# Patient Record
Sex: Female | Born: 1980 | Race: White | Hispanic: Yes | Marital: Married | Smoking: Never smoker
Health system: Southern US, Community
[De-identification: ages and names within clinical notes are randomized; demographics above are authoritative.]

## PROBLEM LIST (undated history)

## (undated) ENCOUNTER — Emergency Department (HOSPITAL_COMMUNITY): Admission: EM | Payer: No Typology Code available for payment source | Source: Home / Self Care

## (undated) DIAGNOSIS — E785 Hyperlipidemia, unspecified: Secondary | ICD-10-CM

## (undated) DIAGNOSIS — E119 Type 2 diabetes mellitus without complications: Secondary | ICD-10-CM

## (undated) DIAGNOSIS — Z9889 Other specified postprocedural states: Secondary | ICD-10-CM

## (undated) DIAGNOSIS — O24419 Gestational diabetes mellitus in pregnancy, unspecified control: Secondary | ICD-10-CM

## (undated) HISTORY — DX: Gestational diabetes mellitus in pregnancy, unspecified control: O24.419

## (undated) HISTORY — PX: BREAST EXCISIONAL BIOPSY: SUR124

## (undated) HISTORY — PX: BREAST CYST ASPIRATION: SHX578

## (undated) HISTORY — DX: Hyperlipidemia, unspecified: E78.5

---

## 2003-03-03 ENCOUNTER — Encounter: Payer: Self-pay | Admitting: *Deleted

## 2003-03-03 ENCOUNTER — Inpatient Hospital Stay (HOSPITAL_COMMUNITY): Admission: AD | Admit: 2003-03-03 | Discharge: 2003-03-03 | Payer: Self-pay | Admitting: *Deleted

## 2003-03-04 ENCOUNTER — Inpatient Hospital Stay (HOSPITAL_COMMUNITY): Admission: AD | Admit: 2003-03-04 | Discharge: 2003-03-05 | Payer: Self-pay | Admitting: Obstetrics and Gynecology

## 2003-03-05 ENCOUNTER — Encounter (INDEPENDENT_AMBULATORY_CARE_PROVIDER_SITE_OTHER): Payer: Self-pay | Admitting: *Deleted

## 2003-03-16 ENCOUNTER — Encounter: Admission: RE | Admit: 2003-03-16 | Discharge: 2003-03-16 | Payer: Self-pay | Admitting: Obstetrics & Gynecology

## 2003-11-13 ENCOUNTER — Other Ambulatory Visit: Admission: RE | Admit: 2003-11-13 | Discharge: 2003-11-13 | Payer: Self-pay | Admitting: Obstetrics and Gynecology

## 2004-01-14 ENCOUNTER — Inpatient Hospital Stay (HOSPITAL_COMMUNITY): Admission: AD | Admit: 2004-01-14 | Discharge: 2004-01-14 | Payer: Self-pay | Admitting: Obstetrics & Gynecology

## 2004-05-09 ENCOUNTER — Ambulatory Visit: Payer: Self-pay | Admitting: Obstetrics & Gynecology

## 2004-05-12 ENCOUNTER — Ambulatory Visit: Payer: Self-pay | Admitting: *Deleted

## 2004-05-15 ENCOUNTER — Ambulatory Visit: Payer: Self-pay | Admitting: Obstetrics & Gynecology

## 2004-05-15 ENCOUNTER — Inpatient Hospital Stay (HOSPITAL_COMMUNITY): Admission: AD | Admit: 2004-05-15 | Discharge: 2004-05-18 | Payer: Self-pay | Admitting: Family Medicine

## 2005-02-23 ENCOUNTER — Ambulatory Visit: Payer: Self-pay | Admitting: Family Medicine

## 2005-02-28 ENCOUNTER — Encounter (INDEPENDENT_AMBULATORY_CARE_PROVIDER_SITE_OTHER): Payer: Self-pay | Admitting: *Deleted

## 2005-02-28 LAB — CONVERTED CEMR LAB

## 2005-03-02 ENCOUNTER — Ambulatory Visit: Payer: Self-pay | Admitting: Family Medicine

## 2005-03-07 ENCOUNTER — Ambulatory Visit: Payer: Self-pay | Admitting: Family Medicine

## 2005-03-22 ENCOUNTER — Ambulatory Visit (HOSPITAL_COMMUNITY): Admission: RE | Admit: 2005-03-22 | Discharge: 2005-03-22 | Payer: Self-pay | Admitting: Family Medicine

## 2005-03-30 ENCOUNTER — Ambulatory Visit: Payer: Self-pay | Admitting: Family Medicine

## 2005-05-03 ENCOUNTER — Ambulatory Visit: Payer: Self-pay | Admitting: Family Medicine

## 2005-05-30 ENCOUNTER — Ambulatory Visit: Payer: Self-pay | Admitting: Family Medicine

## 2005-06-15 ENCOUNTER — Ambulatory Visit: Payer: Self-pay | Admitting: Family Medicine

## 2005-07-05 ENCOUNTER — Ambulatory Visit: Payer: Self-pay | Admitting: Family Medicine

## 2005-07-20 ENCOUNTER — Ambulatory Visit: Payer: Self-pay | Admitting: Family Medicine

## 2005-08-02 ENCOUNTER — Ambulatory Visit: Payer: Self-pay | Admitting: Family Medicine

## 2005-08-20 ENCOUNTER — Inpatient Hospital Stay (HOSPITAL_COMMUNITY): Admission: AD | Admit: 2005-08-20 | Discharge: 2005-08-22 | Payer: Self-pay | Admitting: Obstetrics & Gynecology

## 2005-08-20 ENCOUNTER — Ambulatory Visit: Payer: Self-pay | Admitting: Obstetrics & Gynecology

## 2005-10-13 ENCOUNTER — Ambulatory Visit: Payer: Self-pay | Admitting: Sports Medicine

## 2005-11-03 ENCOUNTER — Ambulatory Visit: Payer: Self-pay | Admitting: Family Medicine

## 2006-03-11 IMAGING — US US OB COMP +14 WK
1 series · 13 of 28 positions shown · non-contrast
Comparison: none

CLINICAL DATA: G3 P1 SAB1.  LMP 11/05/04.  Assess fetal anatomy and gestational age.

[Series 1: us ob comp +14 wk · 13 of 83 slices shown]
[im 4/83]
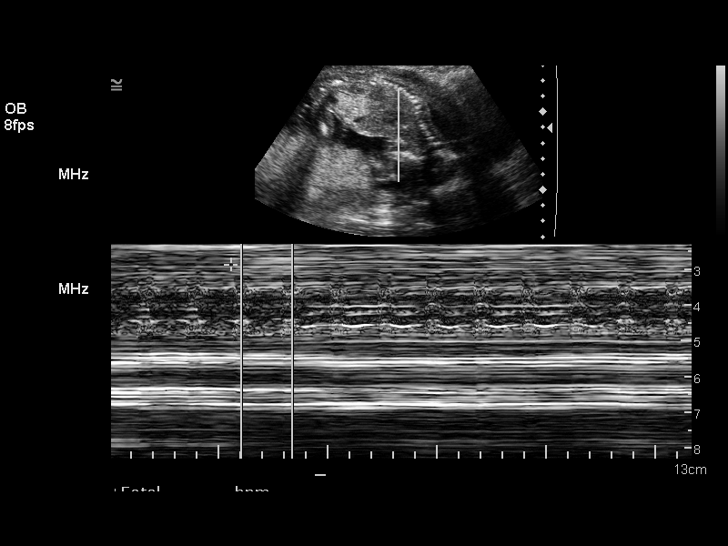
[im 10/83]
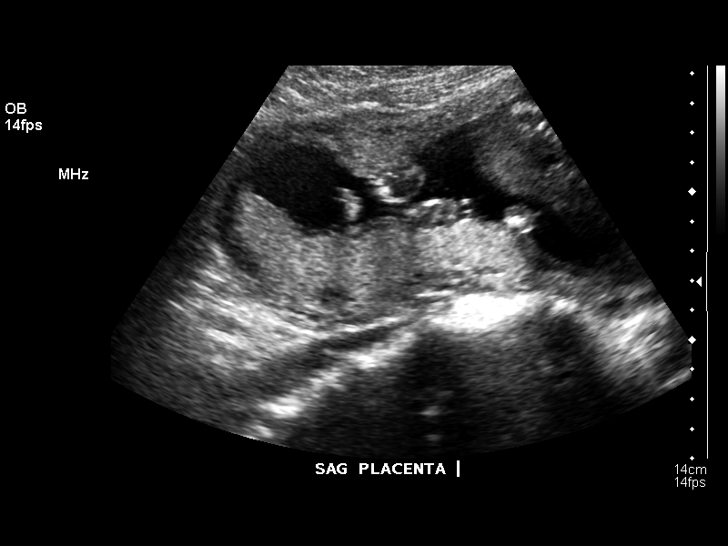
[im 16/83]
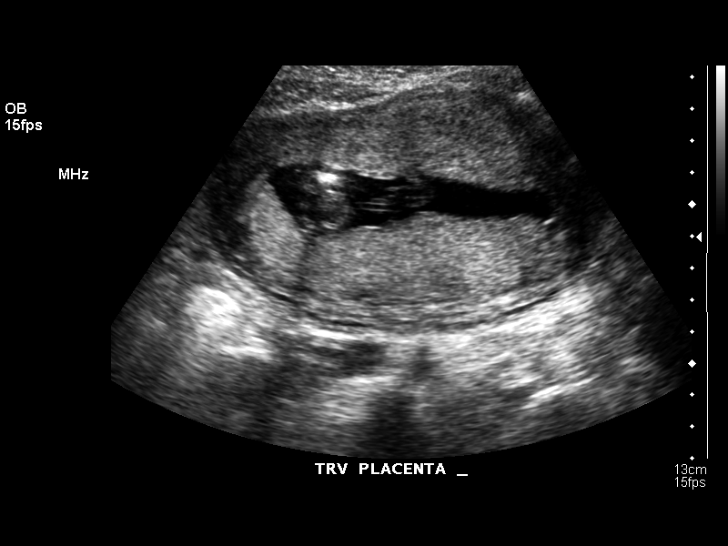
[im 22/83]
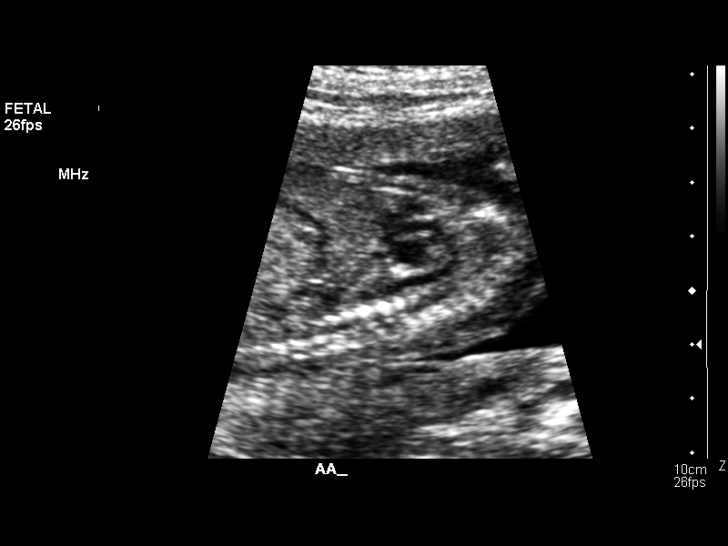
[im 28/83]
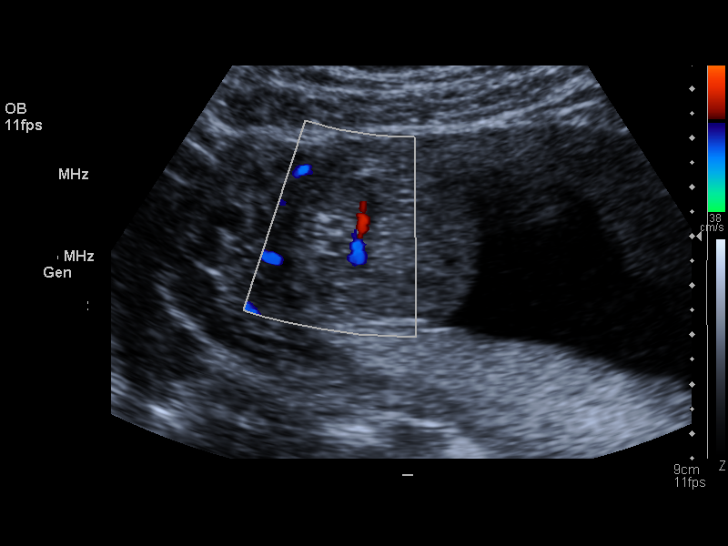
[im 34/83]
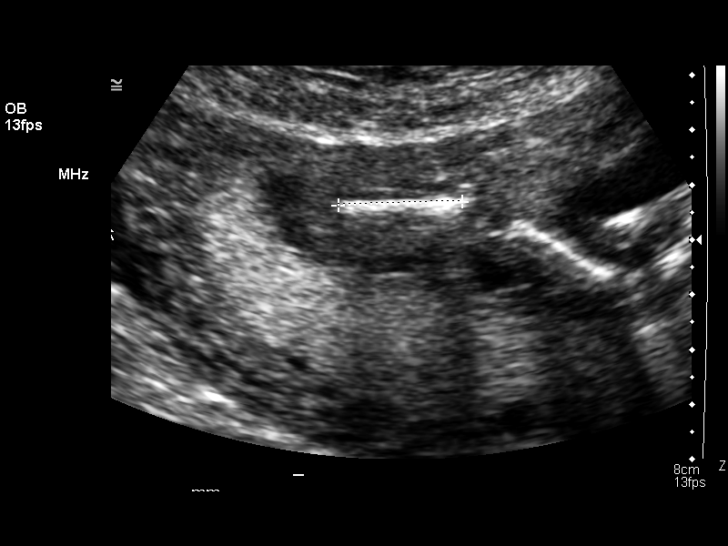
[im 43/83]
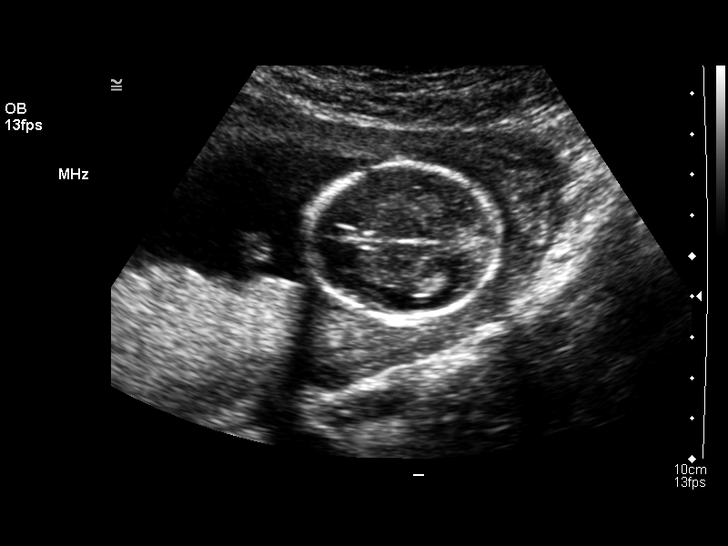
[im 49/83]
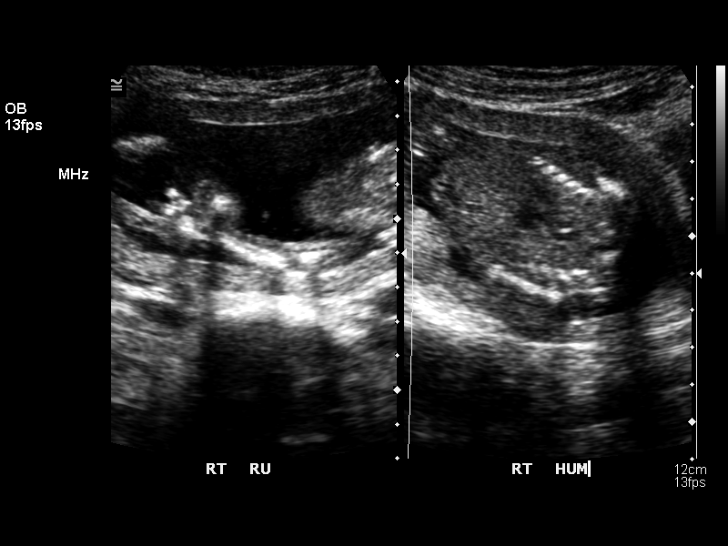
[im 55/83]
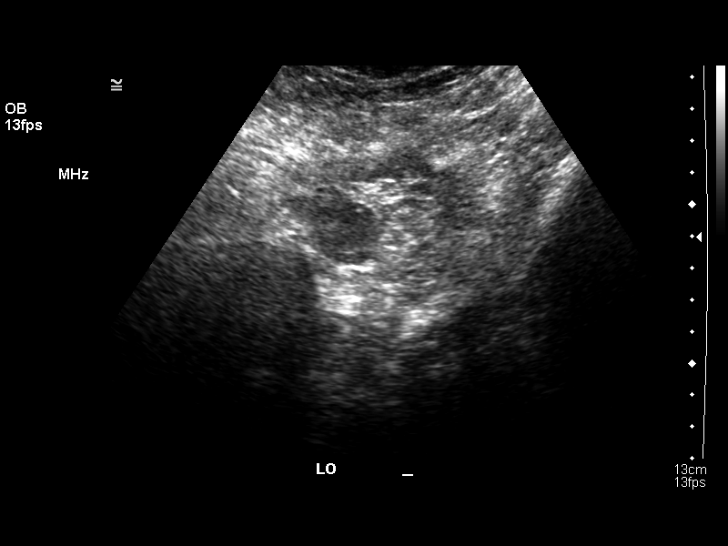
[im 61/83]
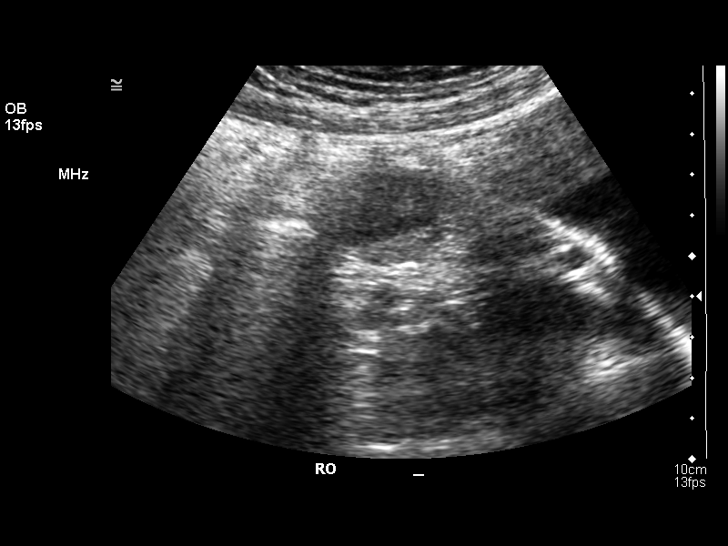
[im 67/83]
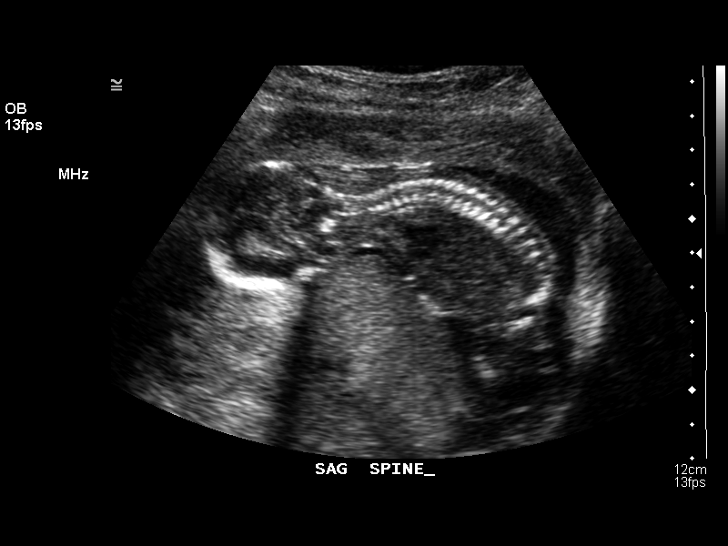
[im 73/83]
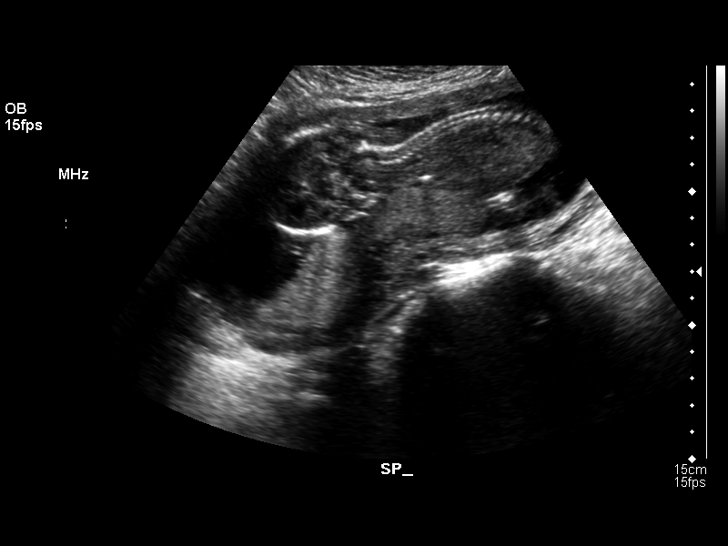
[im 79/83]
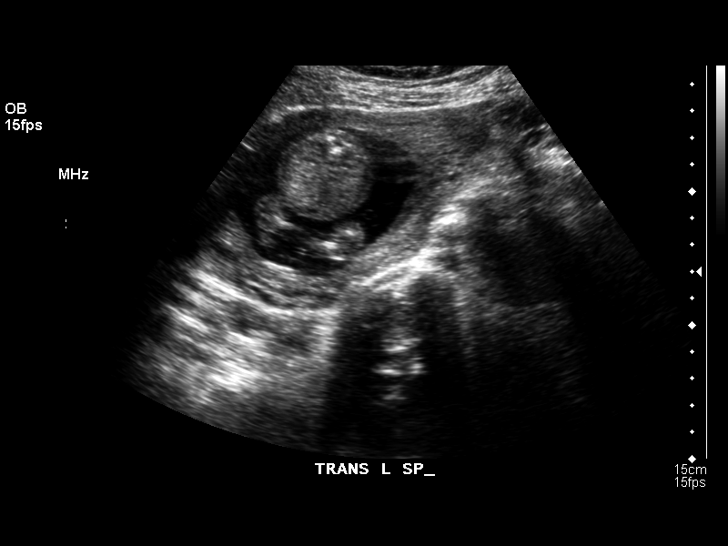

[13 of 28 positions shown; findings below may reference images not displayed]

OBSTETRICAL ULTRASOUND:
 Number of Fetuses:  1
 Heart Rate:  128
 Movement:  Yes
 Breathing:  No  
 Presentation:  Cephalic
 Placental Location:  Posterior
 Grade:  I
 Previa:  No
 Amniotic Fluid (Subjective):  Normal
 Amniotic Fluid (Objective):  4.9 cm Vertical pocket 

 FETAL BIOMETRY
 BPD:   3.8 cm   17 w 4 d
 HC:   14.4 cm   17 w 4 d
 AC:   12.0 cm   17 w 5 d
 FL:    2.2 cm  16 w 4 d

 MEAN GA:  17 w 3 d
 GA BY LMP:  19 w 4 d

 FETAL ANATOMY
 Lateral Ventricles:  Visualized 
 Thalami/CSP:      Visualized 
 Posterior Fossa:  Visualized 
 Nuchal Region:  Visualized 
 Spine:      Visualized 
 4 Chamber Heart on Left:      Not visualized 
 Stomach on Left:      Visualized 
 3 Vessel Cord:  Visualized 
 Cord Insertion site:  Visualized 
 Kidneys:  Visualized 
 Bladder:  Visualized 
 Extremities:      Visualized 

 ADDITIONAL ANATOMY VISUALIZED:  Upper lip, orbits, profile, diaphragm, 5th digit, ductal arch, aortic arch and male genitalia.  

 MATERNAL UTERINE AND ADNEXAL FINDINGS
 Cervix:  4.3 cm Transabdominally.  Normal ovaries.
IMPRESSION: 1.  Single living intrauterine fetus in cephalic presentation.  Patient would be 19 weeks 4 days by LMP dating, but measures 17 weeks 3 days today suggesting that dates may be inaccurate.
 2.  Cardiac anatomy and heel not seen.  Otherwise, no anatomic abnormality noted.

## 2006-09-27 DIAGNOSIS — N921 Excessive and frequent menstruation with irregular cycle: Secondary | ICD-10-CM

## 2006-09-28 ENCOUNTER — Encounter (INDEPENDENT_AMBULATORY_CARE_PROVIDER_SITE_OTHER): Payer: Self-pay | Admitting: *Deleted

## 2006-12-12 ENCOUNTER — Encounter (INDEPENDENT_AMBULATORY_CARE_PROVIDER_SITE_OTHER): Payer: Self-pay | Admitting: *Deleted

## 2007-01-08 ENCOUNTER — Emergency Department (HOSPITAL_COMMUNITY): Admission: EM | Admit: 2007-01-08 | Discharge: 2007-01-08 | Payer: Self-pay | Admitting: Emergency Medicine

## 2007-01-09 ENCOUNTER — Encounter: Payer: Self-pay | Admitting: Family Medicine

## 2007-01-09 ENCOUNTER — Ambulatory Visit: Payer: Self-pay | Admitting: Family Medicine

## 2007-05-27 ENCOUNTER — Telehealth (INDEPENDENT_AMBULATORY_CARE_PROVIDER_SITE_OTHER): Payer: Self-pay | Admitting: *Deleted

## 2007-05-28 ENCOUNTER — Ambulatory Visit: Payer: Self-pay | Admitting: Family Medicine

## 2007-05-28 ENCOUNTER — Encounter: Payer: Self-pay | Admitting: Family Medicine

## 2007-05-28 LAB — CONVERTED CEMR LAB
Bilirubin Urine: NEGATIVE
Ketones, urine, test strip: NEGATIVE
MCV: 90.9 fL (ref 78.0–100.0)
Nitrite: NEGATIVE
Platelets: 243 10*3/uL (ref 150–400)
RDW: 13.7 % (ref 11.5–14.0)
Specific Gravity, Urine: 1.015
WBC: 5 10*3/uL (ref 4.0–10.5)

## 2007-05-31 ENCOUNTER — Ambulatory Visit: Payer: Self-pay | Admitting: Family Medicine

## 2007-07-03 ENCOUNTER — Telehealth (INDEPENDENT_AMBULATORY_CARE_PROVIDER_SITE_OTHER): Payer: Self-pay | Admitting: *Deleted

## 2007-07-03 ENCOUNTER — Ambulatory Visit: Payer: Self-pay | Admitting: Family Medicine

## 2007-08-06 ENCOUNTER — Encounter: Payer: Self-pay | Admitting: *Deleted

## 2007-08-07 ENCOUNTER — Ambulatory Visit: Payer: Self-pay | Admitting: Family Medicine

## 2007-08-08 ENCOUNTER — Encounter: Admission: RE | Admit: 2007-08-08 | Discharge: 2007-08-08 | Payer: Self-pay | Admitting: Family Medicine

## 2007-08-08 ENCOUNTER — Emergency Department (HOSPITAL_COMMUNITY): Admission: EM | Admit: 2007-08-08 | Discharge: 2007-08-09 | Payer: Self-pay | Admitting: Emergency Medicine

## 2007-08-08 ENCOUNTER — Encounter (INDEPENDENT_AMBULATORY_CARE_PROVIDER_SITE_OTHER): Payer: Self-pay | Admitting: Family Medicine

## 2007-08-09 ENCOUNTER — Encounter: Payer: Self-pay | Admitting: *Deleted

## 2007-08-28 ENCOUNTER — Encounter: Admission: RE | Admit: 2007-08-28 | Discharge: 2007-08-28 | Payer: Self-pay | Admitting: Family Medicine

## 2007-09-10 ENCOUNTER — Telehealth: Payer: Self-pay | Admitting: *Deleted

## 2007-09-11 ENCOUNTER — Ambulatory Visit: Payer: Self-pay | Admitting: Family Medicine

## 2007-09-11 ENCOUNTER — Encounter: Admission: RE | Admit: 2007-09-11 | Discharge: 2007-09-11 | Payer: Self-pay | Admitting: Family Medicine

## 2007-09-12 ENCOUNTER — Encounter (INDEPENDENT_AMBULATORY_CARE_PROVIDER_SITE_OTHER): Payer: Self-pay | Admitting: Diagnostic Radiology

## 2007-09-12 ENCOUNTER — Encounter: Admission: RE | Admit: 2007-09-12 | Discharge: 2007-09-12 | Payer: Self-pay | Admitting: Internal Medicine

## 2007-09-24 ENCOUNTER — Ambulatory Visit: Payer: Self-pay | Admitting: Family Medicine

## 2007-09-24 ENCOUNTER — Encounter (INDEPENDENT_AMBULATORY_CARE_PROVIDER_SITE_OTHER): Payer: Self-pay | Admitting: Family Medicine

## 2007-09-24 LAB — CONVERTED CEMR LAB: Chlamydia, DNA Probe: NEGATIVE

## 2007-10-14 ENCOUNTER — Encounter (INDEPENDENT_AMBULATORY_CARE_PROVIDER_SITE_OTHER): Payer: Self-pay | Admitting: Family Medicine

## 2007-10-14 ENCOUNTER — Encounter: Admission: RE | Admit: 2007-10-14 | Discharge: 2007-10-14 | Payer: Self-pay | Admitting: Internal Medicine

## 2007-10-14 ENCOUNTER — Ambulatory Visit: Payer: Self-pay | Admitting: Family Medicine

## 2007-10-14 ENCOUNTER — Ambulatory Visit (HOSPITAL_COMMUNITY): Admission: RE | Admit: 2007-10-14 | Discharge: 2007-10-14 | Payer: Self-pay | Admitting: *Deleted

## 2007-10-14 LAB — CONVERTED CEMR LAB
ALT: 21 units/L (ref 0–35)
AST: 17 units/L (ref 0–37)
Albumin: 4.8 g/dL (ref 3.5–5.2)
Alkaline Phosphatase: 97 units/L (ref 39–117)
Basophils Absolute: 0 10*3/uL (ref 0.0–0.1)
Basophils Relative: 0 % (ref 0–1)
Calcium: 9.6 mg/dL (ref 8.4–10.5)
Chloride: 104 meq/L (ref 96–112)
Eosinophils Absolute: 0.2 10*3/uL (ref 0.0–0.7)
MCHC: 33.3 g/dL (ref 30.0–36.0)
Monocytes Relative: 5 % (ref 3–12)
Neutro Abs: 4.3 10*3/uL (ref 1.7–7.7)
Neutrophils Relative %: 60 % (ref 43–77)
Platelets: 333 10*3/uL (ref 150–400)
Potassium: 4.2 meq/L (ref 3.5–5.3)
RBC: 4.69 M/uL (ref 3.87–5.11)
RDW: 13.5 % (ref 11.5–15.5)
Sodium: 139 meq/L (ref 135–145)

## 2007-10-15 ENCOUNTER — Encounter: Payer: Self-pay | Admitting: Family Medicine

## 2007-10-16 ENCOUNTER — Ambulatory Visit: Payer: Self-pay | Admitting: Family Medicine

## 2007-10-18 ENCOUNTER — Ambulatory Visit: Payer: Self-pay | Admitting: Family Medicine

## 2007-10-21 ENCOUNTER — Encounter (INDEPENDENT_AMBULATORY_CARE_PROVIDER_SITE_OTHER): Payer: Self-pay | Admitting: Family Medicine

## 2007-10-21 ENCOUNTER — Telehealth (INDEPENDENT_AMBULATORY_CARE_PROVIDER_SITE_OTHER): Payer: Self-pay | Admitting: *Deleted

## 2007-10-21 ENCOUNTER — Emergency Department (HOSPITAL_COMMUNITY): Admission: EM | Admit: 2007-10-21 | Discharge: 2007-10-22 | Payer: Self-pay | Admitting: Emergency Medicine

## 2007-10-24 ENCOUNTER — Encounter (INDEPENDENT_AMBULATORY_CARE_PROVIDER_SITE_OTHER): Payer: Self-pay | Admitting: Family Medicine

## 2007-11-01 ENCOUNTER — Ambulatory Visit (HOSPITAL_COMMUNITY): Admission: RE | Admit: 2007-11-01 | Discharge: 2007-11-01 | Payer: Self-pay | Admitting: *Deleted

## 2007-11-01 ENCOUNTER — Encounter (INDEPENDENT_AMBULATORY_CARE_PROVIDER_SITE_OTHER): Payer: Self-pay | Admitting: *Deleted

## 2007-11-07 ENCOUNTER — Encounter (INDEPENDENT_AMBULATORY_CARE_PROVIDER_SITE_OTHER): Payer: Self-pay | Admitting: Family Medicine

## 2008-01-16 ENCOUNTER — Ambulatory Visit: Payer: Self-pay | Admitting: Family Medicine

## 2008-02-05 ENCOUNTER — Ambulatory Visit: Payer: Self-pay | Admitting: Family Medicine

## 2008-02-05 ENCOUNTER — Encounter (INDEPENDENT_AMBULATORY_CARE_PROVIDER_SITE_OTHER): Payer: Self-pay | Admitting: Family Medicine

## 2008-02-05 DIAGNOSIS — L819 Disorder of pigmentation, unspecified: Secondary | ICD-10-CM

## 2008-02-12 ENCOUNTER — Encounter (INDEPENDENT_AMBULATORY_CARE_PROVIDER_SITE_OTHER): Payer: Self-pay | Admitting: Family Medicine

## 2008-02-12 ENCOUNTER — Ambulatory Visit: Payer: Self-pay | Admitting: Family Medicine

## 2008-02-12 LAB — CONVERTED CEMR LAB
LDL Cholesterol: 117 mg/dL — ABNORMAL HIGH (ref 0–99)
Total CHOL/HDL Ratio: 6.2
VLDL: 19 mg/dL (ref 0–40)

## 2008-02-13 LAB — CONVERTED CEMR LAB: Pap Smear: NORMAL

## 2008-02-19 ENCOUNTER — Ambulatory Visit: Payer: Self-pay | Admitting: Family Medicine

## 2008-08-09 ENCOUNTER — Inpatient Hospital Stay (HOSPITAL_COMMUNITY): Admission: AD | Admit: 2008-08-09 | Discharge: 2008-08-09 | Payer: Self-pay | Admitting: Obstetrics and Gynecology

## 2008-08-12 ENCOUNTER — Ambulatory Visit: Payer: Self-pay | Admitting: Family Medicine

## 2008-08-12 DIAGNOSIS — N912 Amenorrhea, unspecified: Secondary | ICD-10-CM | POA: Insufficient documentation

## 2008-08-25 ENCOUNTER — Encounter: Payer: Self-pay | Admitting: Family Medicine

## 2008-08-25 ENCOUNTER — Ambulatory Visit: Payer: Self-pay | Admitting: Family Medicine

## 2008-08-25 LAB — CONVERTED CEMR LAB
Basophils Absolute: 0 10*3/uL (ref 0.0–0.1)
Basophils Relative: 0 % (ref 0–1)
Hemoglobin: 11.8 g/dL — ABNORMAL LOW (ref 12.0–15.0)
Lymphocytes Relative: 29 % (ref 12–46)
MCHC: 31.2 g/dL (ref 30.0–36.0)
Monocytes Absolute: 0.5 10*3/uL (ref 0.1–1.0)
Monocytes Relative: 7 % (ref 3–12)
Neutro Abs: 4.7 10*3/uL (ref 1.7–7.7)
Neutrophils Relative %: 62 % (ref 43–77)
RBC: 4.24 M/uL (ref 3.87–5.11)
RDW: 14.1 % (ref 11.5–15.5)
Rubella: >500 intl units/mL — ABNORMAL HIGH
Sickle Cell Screen: NEGATIVE

## 2008-09-01 ENCOUNTER — Encounter: Payer: Self-pay | Admitting: Family Medicine

## 2008-09-01 ENCOUNTER — Ambulatory Visit: Payer: Self-pay | Admitting: Family Medicine

## 2008-09-01 LAB — CONVERTED CEMR LAB
Chlamydia, DNA Probe: NEGATIVE
GC Probe Amp, Genital: NEGATIVE

## 2008-09-25 ENCOUNTER — Ambulatory Visit: Payer: Self-pay | Admitting: Family Medicine

## 2008-09-29 ENCOUNTER — Ambulatory Visit: Payer: Self-pay | Admitting: Family Medicine

## 2008-10-02 IMAGING — CR DG CHEST 2V
2 series · 2 of 2 positions shown · non-contrast
Comparison: None.

CLINICAL DATA: Possible TB exposure. 
 CHEST - 2 VIEW:

[w chest pa]
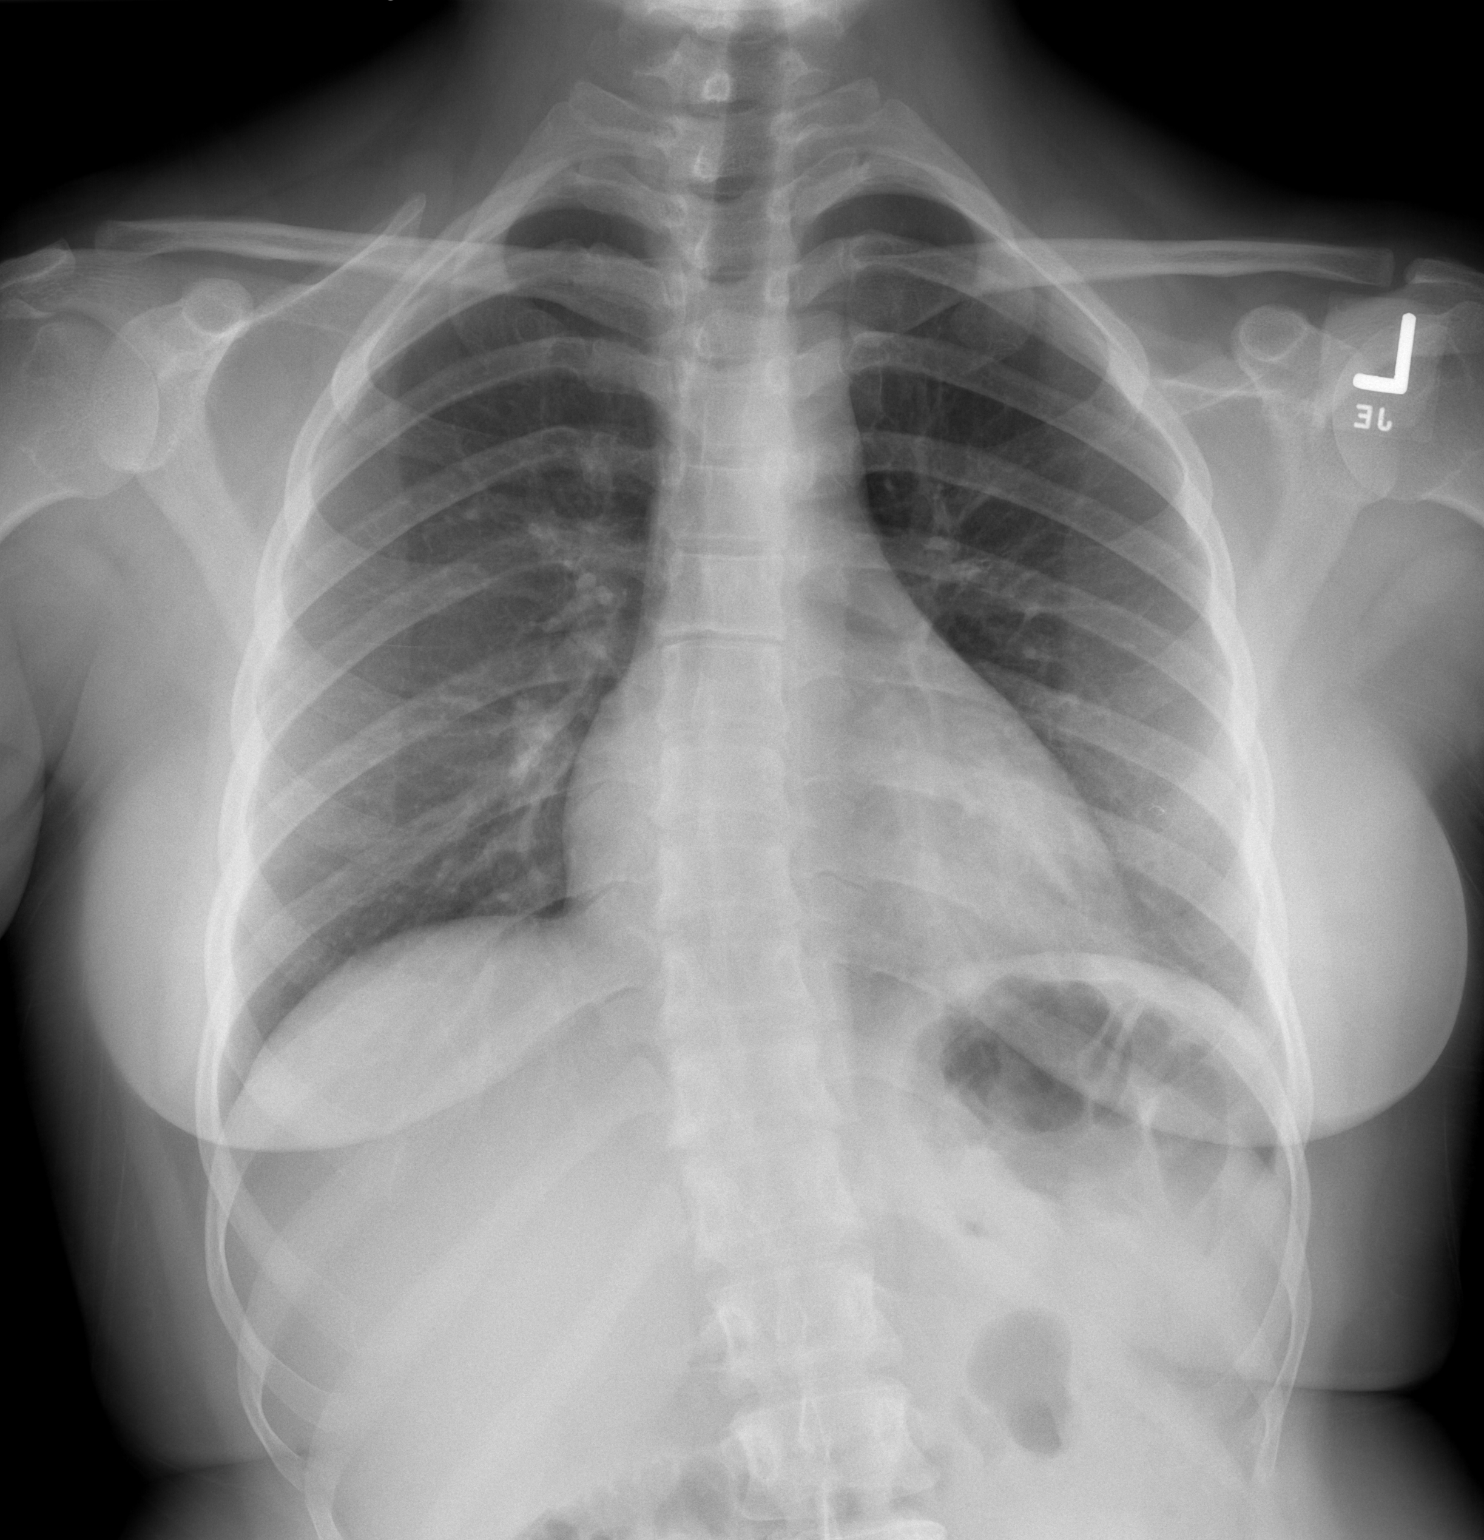

[w chest lat]
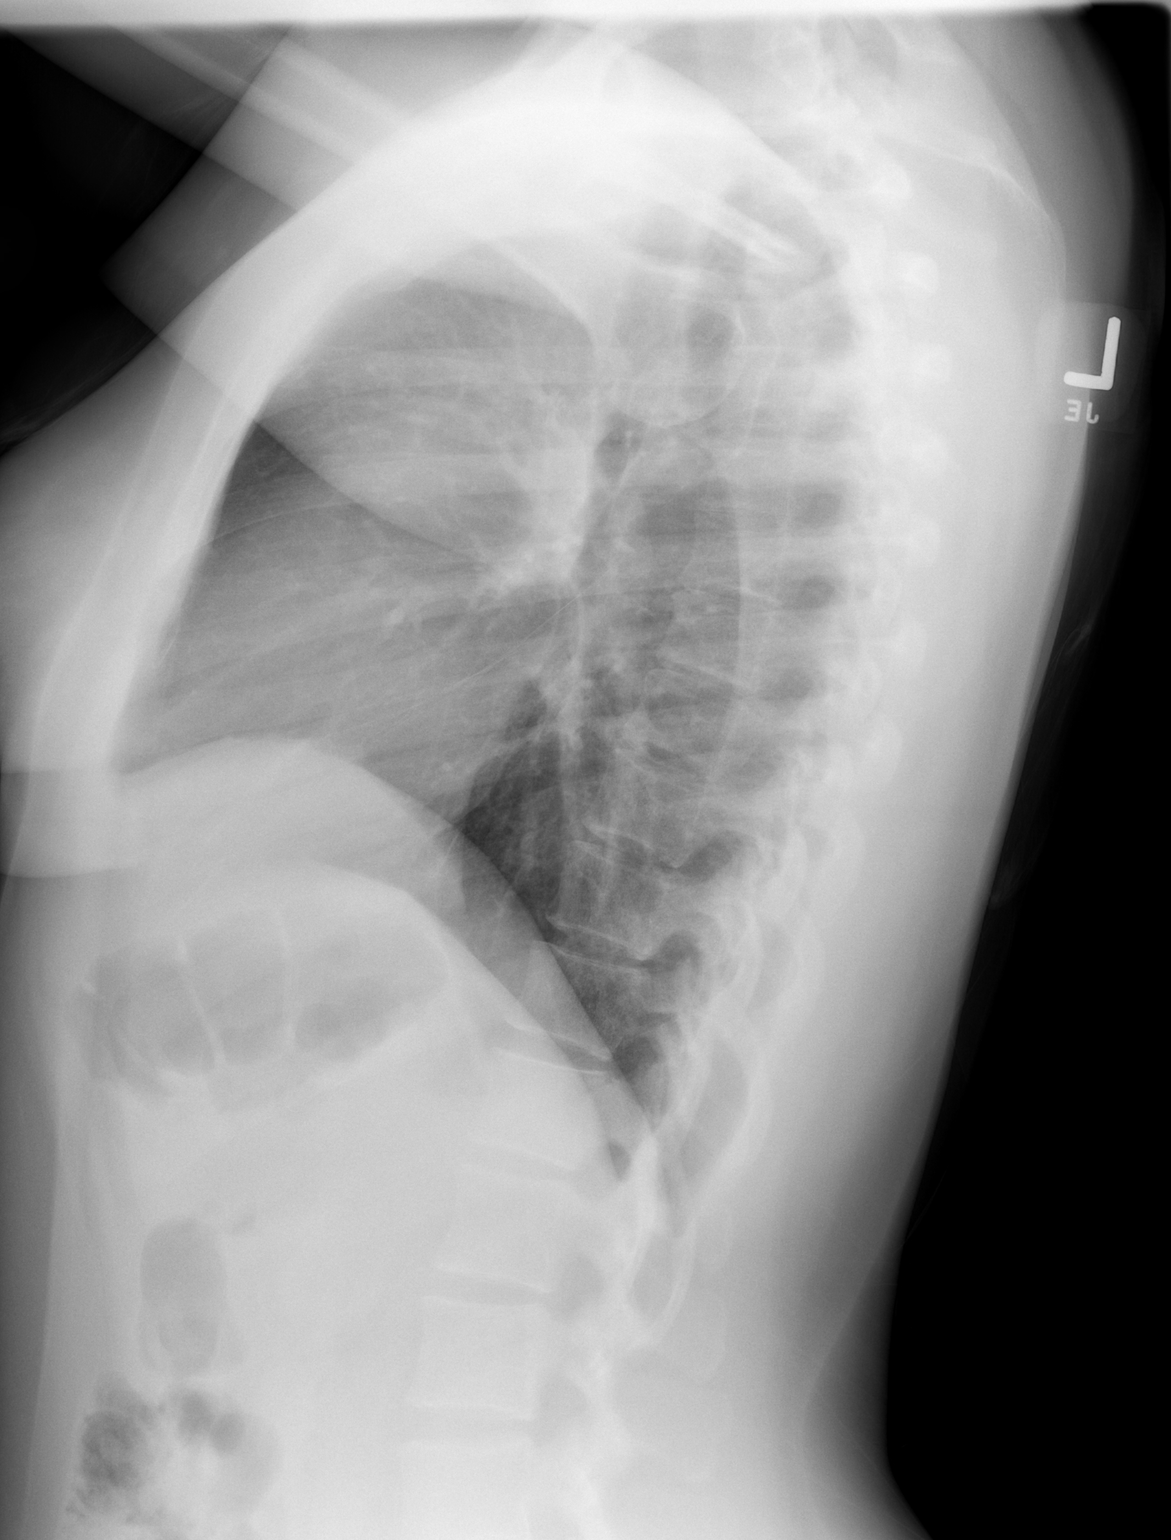

[2 of 2 positions shown; findings below may reference images not displayed]

FINDINGS: The lungs are clear.  Heart size is normal.  No pleural effusion or focal bony abnormality.
IMPRESSION: No acute disease.

## 2008-10-08 ENCOUNTER — Telehealth: Payer: Self-pay | Admitting: Family Medicine

## 2008-10-12 ENCOUNTER — Encounter: Payer: Self-pay | Admitting: Family Medicine

## 2008-10-12 ENCOUNTER — Ambulatory Visit: Payer: Self-pay | Admitting: Family Medicine

## 2008-10-12 DIAGNOSIS — N61 Mastitis without abscess: Secondary | ICD-10-CM

## 2008-10-14 ENCOUNTER — Encounter (INDEPENDENT_AMBULATORY_CARE_PROVIDER_SITE_OTHER): Payer: Self-pay | Admitting: Family Medicine

## 2008-10-16 ENCOUNTER — Encounter: Payer: Self-pay | Admitting: Family Medicine

## 2008-10-16 ENCOUNTER — Ambulatory Visit: Payer: Self-pay | Admitting: Family Medicine

## 2008-10-16 DIAGNOSIS — N63 Unspecified lump in unspecified breast: Secondary | ICD-10-CM | POA: Insufficient documentation

## 2008-10-20 ENCOUNTER — Encounter (INDEPENDENT_AMBULATORY_CARE_PROVIDER_SITE_OTHER): Payer: Self-pay | Admitting: Family Medicine

## 2008-10-20 ENCOUNTER — Encounter: Admission: RE | Admit: 2008-10-20 | Discharge: 2008-10-20 | Payer: Self-pay | Admitting: Family Medicine

## 2008-10-22 ENCOUNTER — Ambulatory Visit: Payer: Self-pay | Admitting: Obstetrics & Gynecology

## 2008-10-26 ENCOUNTER — Other Ambulatory Visit: Payer: Self-pay | Admitting: Obstetrics & Gynecology

## 2008-10-26 ENCOUNTER — Encounter: Admission: RE | Admit: 2008-10-26 | Discharge: 2009-01-24 | Payer: Self-pay | Admitting: Obstetrics & Gynecology

## 2008-11-02 ENCOUNTER — Encounter: Payer: Self-pay | Admitting: Family Medicine

## 2008-11-02 ENCOUNTER — Ambulatory Visit: Payer: Self-pay | Admitting: Family Medicine

## 2008-11-02 ENCOUNTER — Ambulatory Visit: Payer: Self-pay | Admitting: Obstetrics & Gynecology

## 2008-11-05 ENCOUNTER — Encounter (INDEPENDENT_AMBULATORY_CARE_PROVIDER_SITE_OTHER): Payer: Self-pay | Admitting: Family Medicine

## 2008-11-11 ENCOUNTER — Ambulatory Visit: Payer: Self-pay | Admitting: Obstetrics & Gynecology

## 2008-11-11 ENCOUNTER — Encounter: Payer: Self-pay | Admitting: Obstetrics and Gynecology

## 2008-11-11 LAB — CONVERTED CEMR LAB
Albumin: 3.7 g/dL (ref 3.5–5.2)
BUN: 7 mg/dL (ref 6–23)
CO2: 22 meq/L (ref 19–32)
Calcium: 9.6 mg/dL (ref 8.4–10.5)
Chloride: 105 meq/L (ref 96–112)
Creatinine, Ser: 0.52 mg/dL (ref 0.40–1.20)
Glucose, Bld: 70 mg/dL (ref 70–99)
HCT: 34.2 % — ABNORMAL LOW (ref 36.0–46.0)
Hemoglobin: 11.1 g/dL — ABNORMAL LOW (ref 12.0–15.0)
Hgb A1c MFr Bld: 5.3 % (ref 4.6–6.1)
Potassium: 4.8 meq/L (ref 3.5–5.3)
RBC: 3.87 M/uL (ref 3.87–5.11)
TSH: 1.108 microintl units/mL (ref 0.350–4.500)
Uric Acid, Serum: 2.4 mg/dL (ref 2.4–7.0)

## 2008-11-13 ENCOUNTER — Encounter: Payer: Self-pay | Admitting: Obstetrics and Gynecology

## 2008-11-13 ENCOUNTER — Ambulatory Visit: Payer: Self-pay | Admitting: Obstetrics & Gynecology

## 2008-11-13 LAB — CONVERTED CEMR LAB
Collection Interval-CRCL: 24 hr
Creatinine Clearance: 177 mL/min — ABNORMAL HIGH (ref 75–115)

## 2008-11-23 ENCOUNTER — Ambulatory Visit: Payer: Self-pay | Admitting: Obstetrics & Gynecology

## 2008-11-30 ENCOUNTER — Ambulatory Visit: Payer: Self-pay | Admitting: Obstetrics & Gynecology

## 2008-12-09 ENCOUNTER — Other Ambulatory Visit: Admission: RE | Admit: 2008-12-09 | Discharge: 2008-12-09 | Payer: Self-pay | Admitting: General Surgery

## 2008-12-10 ENCOUNTER — Ambulatory Visit: Payer: Self-pay | Admitting: Family Medicine

## 2008-12-14 ENCOUNTER — Ambulatory Visit: Payer: Self-pay | Admitting: Obstetrics & Gynecology

## 2008-12-17 ENCOUNTER — Encounter: Admission: RE | Admit: 2008-12-17 | Discharge: 2008-12-17 | Payer: Self-pay | Admitting: General Surgery

## 2008-12-21 ENCOUNTER — Ambulatory Visit: Payer: Self-pay | Admitting: Obstetrics & Gynecology

## 2008-12-24 ENCOUNTER — Ambulatory Visit (HOSPITAL_COMMUNITY): Admission: RE | Admit: 2008-12-24 | Discharge: 2008-12-24 | Payer: Self-pay | Admitting: General Surgery

## 2008-12-25 ENCOUNTER — Encounter (INDEPENDENT_AMBULATORY_CARE_PROVIDER_SITE_OTHER): Payer: Self-pay | Admitting: General Surgery

## 2008-12-25 ENCOUNTER — Ambulatory Visit (HOSPITAL_COMMUNITY): Admission: RE | Admit: 2008-12-25 | Discharge: 2008-12-25 | Payer: Self-pay | Admitting: Pediatrics

## 2008-12-28 ENCOUNTER — Inpatient Hospital Stay (HOSPITAL_COMMUNITY): Admission: AD | Admit: 2008-12-28 | Discharge: 2008-12-28 | Payer: Self-pay | Admitting: Obstetrics & Gynecology

## 2008-12-28 ENCOUNTER — Ambulatory Visit: Payer: Self-pay | Admitting: Advanced Practice Midwife

## 2008-12-31 ENCOUNTER — Ambulatory Visit: Payer: Self-pay | Admitting: Obstetrics and Gynecology

## 2009-01-04 ENCOUNTER — Ambulatory Visit: Payer: Self-pay | Admitting: Obstetrics & Gynecology

## 2009-01-11 ENCOUNTER — Encounter: Payer: Self-pay | Admitting: Obstetrics and Gynecology

## 2009-01-11 ENCOUNTER — Ambulatory Visit: Payer: Self-pay | Admitting: Obstetrics & Gynecology

## 2009-01-11 LAB — CONVERTED CEMR LAB
HCT: 32.3 % — ABNORMAL LOW (ref 36.0–46.0)
Hemoglobin: 10.8 g/dL — ABNORMAL LOW (ref 12.0–15.0)
MCHC: 33.4 g/dL (ref 30.0–36.0)
MCV: 87.5 fL (ref 78.0–100.0)
RBC: 3.69 M/uL — ABNORMAL LOW (ref 3.87–5.11)

## 2009-01-18 ENCOUNTER — Ambulatory Visit: Payer: Self-pay | Admitting: Obstetrics & Gynecology

## 2009-01-25 ENCOUNTER — Encounter: Payer: Self-pay | Admitting: Family Medicine

## 2009-01-25 ENCOUNTER — Ambulatory Visit: Payer: Self-pay | Admitting: Obstetrics & Gynecology

## 2009-01-25 ENCOUNTER — Encounter: Admission: RE | Admit: 2009-01-25 | Discharge: 2009-03-15 | Payer: Self-pay | Admitting: Obstetrics & Gynecology

## 2009-02-01 ENCOUNTER — Ambulatory Visit: Payer: Self-pay | Admitting: Obstetrics & Gynecology

## 2009-02-03 ENCOUNTER — Ambulatory Visit: Payer: Self-pay | Admitting: Family Medicine

## 2009-02-03 ENCOUNTER — Encounter: Payer: Self-pay | Admitting: Family Medicine

## 2009-02-08 ENCOUNTER — Ambulatory Visit: Payer: Self-pay | Admitting: Obstetrics & Gynecology

## 2009-02-15 ENCOUNTER — Ambulatory Visit: Payer: Self-pay | Admitting: Obstetrics & Gynecology

## 2009-02-15 ENCOUNTER — Encounter: Payer: Self-pay | Admitting: Family Medicine

## 2009-02-18 ENCOUNTER — Ambulatory Visit (HOSPITAL_COMMUNITY): Admission: RE | Admit: 2009-02-18 | Discharge: 2009-02-18 | Payer: Self-pay | Admitting: Obstetrics & Gynecology

## 2009-02-18 ENCOUNTER — Ambulatory Visit: Payer: Self-pay | Admitting: Obstetrics & Gynecology

## 2009-02-22 ENCOUNTER — Ambulatory Visit: Payer: Self-pay | Admitting: Obstetrics & Gynecology

## 2009-02-25 ENCOUNTER — Ambulatory Visit: Payer: Self-pay | Admitting: Obstetrics & Gynecology

## 2009-03-01 ENCOUNTER — Ambulatory Visit: Payer: Self-pay | Admitting: Obstetrics & Gynecology

## 2009-03-01 ENCOUNTER — Encounter: Admission: RE | Admit: 2009-03-01 | Discharge: 2009-03-01 | Payer: Self-pay | Admitting: Obstetrics & Gynecology

## 2009-03-04 ENCOUNTER — Ambulatory Visit: Payer: Self-pay | Admitting: Obstetrics & Gynecology

## 2009-03-08 ENCOUNTER — Encounter: Payer: Self-pay | Admitting: Obstetrics & Gynecology

## 2009-03-08 ENCOUNTER — Ambulatory Visit: Payer: Self-pay | Admitting: Obstetrics & Gynecology

## 2009-03-09 ENCOUNTER — Encounter: Payer: Self-pay | Admitting: Obstetrics & Gynecology

## 2009-03-11 ENCOUNTER — Ambulatory Visit: Payer: Self-pay | Admitting: Obstetrics & Gynecology

## 2009-03-15 ENCOUNTER — Ambulatory Visit: Payer: Self-pay | Admitting: Obstetrics & Gynecology

## 2009-03-16 ENCOUNTER — Ambulatory Visit: Payer: Self-pay | Admitting: Family Medicine

## 2009-03-16 ENCOUNTER — Encounter: Payer: Self-pay | Admitting: Family Medicine

## 2009-03-18 ENCOUNTER — Ambulatory Visit: Payer: Self-pay | Admitting: Obstetrics & Gynecology

## 2009-03-22 ENCOUNTER — Ambulatory Visit: Payer: Self-pay | Admitting: Obstetrics & Gynecology

## 2009-03-25 ENCOUNTER — Ambulatory Visit: Payer: Self-pay | Admitting: Obstetrics & Gynecology

## 2009-03-25 ENCOUNTER — Ambulatory Visit (HOSPITAL_COMMUNITY): Admission: RE | Admit: 2009-03-25 | Discharge: 2009-03-25 | Payer: Self-pay | Admitting: Family Medicine

## 2009-03-28 ENCOUNTER — Ambulatory Visit: Payer: Self-pay | Admitting: Obstetrics and Gynecology

## 2009-03-28 ENCOUNTER — Inpatient Hospital Stay (HOSPITAL_COMMUNITY): Admission: AD | Admit: 2009-03-28 | Discharge: 2009-03-30 | Payer: Self-pay | Admitting: Obstetrics & Gynecology

## 2009-04-06 ENCOUNTER — Encounter: Payer: Self-pay | Admitting: Family Medicine

## 2009-05-13 ENCOUNTER — Encounter: Payer: Self-pay | Admitting: Family Medicine

## 2009-05-13 ENCOUNTER — Ambulatory Visit: Payer: Self-pay | Admitting: Family Medicine

## 2009-05-13 DIAGNOSIS — Z8742 Personal history of other diseases of the female genital tract: Secondary | ICD-10-CM | POA: Insufficient documentation

## 2009-07-06 ENCOUNTER — Encounter
Admission: RE | Admit: 2009-07-06 | Discharge: 2009-07-06 | Disposition: A | Discharge status: Home / Self Care | Payer: Self-pay | Admitting: General Surgery

## 2009-11-12 ENCOUNTER — Ambulatory Visit: Payer: Self-pay | Admitting: Family Medicine

## 2009-11-12 LAB — CONVERTED CEMR LAB: Pap Smear: NEGATIVE

## 2010-07-05 ENCOUNTER — Encounter: Payer: Self-pay | Admitting: Family Medicine

## 2010-07-08 ENCOUNTER — Encounter: Payer: Self-pay | Admitting: Family Medicine

## 2010-07-08 ENCOUNTER — Inpatient Hospital Stay (HOSPITAL_COMMUNITY)
Admission: AD | Admit: 2010-07-08 | Discharge: 2010-07-08 | Payer: Self-pay | Source: Home / Self Care | Attending: Family Medicine | Admitting: Family Medicine

## 2010-07-31 DIAGNOSIS — O24419 Gestational diabetes mellitus in pregnancy, unspecified control: Secondary | ICD-10-CM

## 2010-07-31 HISTORY — PX: TUBAL LIGATION: SHX77

## 2010-07-31 HISTORY — DX: Gestational diabetes mellitus in pregnancy, unspecified control: O24.419

## 2010-08-02 ENCOUNTER — Encounter: Payer: Self-pay | Admitting: Family Medicine

## 2010-08-02 ENCOUNTER — Ambulatory Visit: Admission: RE | Admit: 2010-08-02 | Discharge: 2010-08-02 | Payer: Self-pay | Source: Home / Self Care

## 2010-08-04 ENCOUNTER — Encounter: Payer: Self-pay | Admitting: Family Medicine

## 2010-08-04 LAB — CONVERTED CEMR LAB
Antibody Screen: NEGATIVE
Basophils Absolute: 0 10*3/uL (ref 0.0–0.1)
Eosinophils Relative: 3 % (ref 0–5)
HCT: 39.7 % (ref 36.0–46.0)
Hemoglobin: 12.3 g/dL (ref 12.0–15.0)
Lymphocytes Relative: 24 % (ref 12–46)
Lymphs Abs: 1.9 10*3/uL (ref 0.7–4.0)
Monocytes Absolute: 0.4 10*3/uL (ref 0.1–1.0)
Monocytes Relative: 5 % (ref 3–12)
Neutro Abs: 5.2 10*3/uL (ref 1.7–7.7)
RBC: 4.14 M/uL (ref 3.87–5.11)
RDW: 14.2 % (ref 11.5–15.5)
Rh Type: POSITIVE
Rubella: 262.3 intl units/mL — ABNORMAL HIGH
WBC: 7.8 10*3/uL (ref 4.0–10.5)

## 2010-08-08 ENCOUNTER — Other Ambulatory Visit
Admission: RE | Admit: 2010-08-08 | Discharge: 2010-08-08 | Payer: Self-pay | Source: Home / Self Care | Admitting: Family Medicine

## 2010-08-08 ENCOUNTER — Ambulatory Visit: Admission: RE | Admit: 2010-08-08 | Discharge: 2010-08-08 | Payer: Self-pay | Source: Home / Self Care

## 2010-08-08 ENCOUNTER — Encounter: Payer: Self-pay | Admitting: Family Medicine

## 2010-08-08 DIAGNOSIS — N898 Other specified noninflammatory disorders of vagina: Secondary | ICD-10-CM | POA: Insufficient documentation

## 2010-08-08 DIAGNOSIS — R7309 Other abnormal glucose: Secondary | ICD-10-CM | POA: Insufficient documentation

## 2010-08-08 LAB — CONVERTED CEMR LAB
Chlamydia, DNA Probe: NEGATIVE
GC Probe Amp, Genital: NEGATIVE

## 2010-08-15 LAB — GLUCOSE, CAPILLARY: Glucose-Capillary: 193 mg/dL — ABNORMAL HIGH (ref 70–99)

## 2010-08-21 ENCOUNTER — Encounter: Payer: Self-pay | Admitting: Internal Medicine

## 2010-08-30 NOTE — Assessment & Plan Note (Signed)
Summary: CPP/Wenden/Caviness   Vital Signs:  Patient profile:   30 year old female Height:      58.5 inches Weight:      146 pounds BMI:     30.10 Temp:     98.3 degrees F oral Pulse rate:   76 / minute BP sitting:   95 / 56  (right arm) Cuff size:   regular  Vitals Entered By: Tessie Fass CMA (November 12, 2009 11:17 AM) CC: complete physical with pap Pain Assessment Patient in pain? no        Primary Care Provider:  Ellin Mayhew MD  CC:  complete physical with pap.  History of Present Illness: CC: CPE  Had breast pain (chronic granulomatous mastitis), now resolved.  Lives at home with husband and 3 children.  Feels safe at home.  Had pap smear when pregnant.  No abnormal ones.  would like Tdap today (last thinks 2002 or 2003 but unsure.)  h/o GDM, A1c 5.3% 10/2008.  Habits & Providers  Alcohol-Tobacco-Diet     Tobacco Status: never  Allergies: No Known Drug Allergies  Past History:  Past medical, surgical, family and social histories (including risk factors) reviewed for relevance to current acute and chronic problems.  Past Medical History: Reviewed history from 05/13/2009 and no changes required. IUFD---1rst pregnancy `04,  NSVD--07/2005 and 02/2009 (gestational DM)  Past Surgical History: Reviewed history from 12/10/2008 and no changes required. Mirena IUD insertion - 10/26/2005 Surgery removal breast mass - 10/2007 Surgery FNA of breast mass 11/2008  Family History: Reviewed history from 09/01/2008 and no changes required. infant daughter has a heart murmur--`06, Several members in family with congenital heart defects. sister: breast cancer at 22 - deceased Father: HTN, ulcers, HLD Mother: DM  Social History: Reviewed history from 12/10/2008 and no changes required. Originally from [place]. Does not work outside the house. Does not speak english. Completed elementary school. Catholic religion. Husband Bartholome Bill who works as Designer, fashion/clothing - currently unemployed. They have a daughter and son (they want to be doctors). no ETOH, no illicits, no tobacco  Review of Systems  The patient denies anorexia, fever, weight loss, vision loss, decreased hearing, chest pain, syncope, dyspnea on exertion, peripheral edema, prolonged cough, headaches, abdominal pain, melena, severe indigestion/heartburn, hematuria, and muscle weakness.    Physical Exam  General:  alert in no acute distress   Neck:  supple and no masses.   Breasts:  + tender to palpation throughout breast tissue.  + scars from abscess removals bilateral breasts.  No axillary LAD.  No masses appreciated. Lungs:  normal respiratory effort.  no crackles and no wheezes.   Heart:  normal rate and regular rhythm.  no murmur.   Genitalia:  Pelvic Exam:        External: normal female genitalia without lesions or masses        Vagina: normal without lesions or masses        Cervix: slight erythema around os        Adnexa: normal bimanual exam without masses or fullness        Uterus: normal by palpation        Pap smear: performed Msk:  No deformity or scoliosis noted of thoracic or lumbar spine.     Impression & Recommendations:  Problem # 1:  ROUTINE GYNECOLOGICAL EXAMINATION (ICD-V72.31)  healthy well woman exam.  Tdap today  Orders: [facility] - Est  18-39 yrs (54098)  Problem # 2:  SCREENING FOR MALIGNANT NEOPLASM, CERVIX (ICD-V76.2)  pap today.  if normal, may space out.  Orders: Pap Smear-[facility] ([id number]) [facility] - Est  18-39 yrs ([id number])  Problem # 3:  BREAST MASS, LEFT (ICD-611.72) h/o chronic granulomatous mastitis, followed by surgery.  not bothering her currently.  Complete Medication List: 1)  Rhinocort Aqua 32 Mcg/act Susp (Budesonide) .... 2 sprays each nostril daily x 10 days, then as needed for congestion 2)  Prenatal Vitamins 0.8 Mg Tabs (Prenatal multivit-min-fe-fa) .... Take 1 tab by mouth daily  Other Orders: Tdap => 18yrs IM ([id number]) Admin  1st Vaccine (43329)  Patient Instructions: 1)  Gusto verla hoy. 2)  Llame a clinica con preguntas. 3)  Regresar si su dolor de pecho sigue.  Todo se vio normal hoy.  Recibio vacuna contra el tetano y la diphtheria hoy.   Prevention & Chronic Care Immunizations   Influenza vaccine: Not documented    Tetanus booster: 11/12/2009: Tdap   Tetanus booster due: 11/13/2019    Pneumococcal vaccine: Not documented   Pneumococcal vaccine deferral: Not indicated  (11/12/2009)  Other Screening   Pap smear: NEGATIVE FOR INTRAEPITHELIAL LESIONS OR MALIGNANCY.  (09/01/2008)   Pap smear due: 02/2009   Smoking status: never  (11/12/2009)    Immunizations Administered:  Tetanus Vaccine:    Vaccine Type: Tdap    Site: right deltoid    Mfr: GlaxoSmithKline    Dose: 0.5 ml    Route: IM    Given by: Tessie Fass CMA    Exp. Date: 10/22/2011    Lot #: [id number]    VIS given: 06/18/07 version given November 12, 2009.

## 2010-08-30 NOTE — Miscellaneous (Signed)
  Clinical Lists Changes  Problems: Removed problem of BLOOD IN STOOL (ICD-578.1) Removed problem of ORTHOSTATIC DIZZINESS (ICD-780.4) Removed problem of GESTATIONAL DIABETES (ICD-648.80) Removed problem of PREGNANCY, NORMAL (ICD-V22.2) Removed problem of GRIEF REACTION (ICD-309.0) Removed problem of SCREENING, PULMONARY TUBERCULOSIS (ICD-V74.1) Removed problem of COMMUNICABLE DISEASE, EXPOSURE TO (ICD-V01.9) Removed problem of SEXUALLY TRANSMITTED DISEASE, EXPOSURE TO (ICD-V01.6) Removed problem of SCREENING FOR MALIGNANT NEOPLASM, CERVIX (ICD-V76.2) Removed problem of EXAMINATION, ROUTINE MEDICAL (ICD-V70.0)      Allergies: No Known Drug Allergies

## 2010-09-01 NOTE — Assessment & Plan Note (Signed)
Summary: NOB visit   Vital Signs:  Patient profile:   31 year old female LMP:     05/13/2010 Height:      58.5 inches Weight:      155 pounds Pulse rate:   72 / minute BP sitting:   96 / 52  (right arm) Cuff size:   regular  Vitals Entered By: Tessie Fass CMA (August 08, 2010 1:35 PM) CC: NEW OB LMP (date): 05/13/2010 EDC by LMP==> 02/17/2011 EDC 02/17/2011 LMP - Character: normal LMP - Reliable? Yes Menarche (age onset years): 9   Menses interval (days): 28 Menstrual flow (days): 4 On BCP's at conception: no Date of + home preg. test: 07/27/2010 Enter LMP: 05/13/2010 Last PAP Result NEGATIVE FOR INTRAEPITHELIAL LESIONS OR MALIGNANCY.   Primary Care Provider:  Ellin Mayhew MD  CC:  NEW OB.  History of Present Illness: Pt states that she is here for a new ob appt.  She has a hx of gestational diabetes with her last pregnancy.  She agrees to an early glucola.  was using condoms for protection.  This was a suprise pregnancy but she is very happy about it.  Pt was evaluated early in pregnancy for vaginal bleeding- but 8 week u/s showed things to be wnl.  Pt reassured at ed visit that things were progressing normally and told to f/up with me.     Habits & Providers  Alcohol-Tobacco-Diet     Cigarette Packs/Day: n/a  Current Medications (verified): 1)  Rhinocort Aqua 32 Mcg/act Susp (Budesonide) .... 2 Sprays Each Nostril Daily X 10 Days, Then As Needed For Congestion 2)  Prenatal Vitamins 0.8 Mg Tabs (Prenatal Multivit-Min-Fe-Fa) .... Take 1 Tab By Mouth Daily  Allergies (verified): No Known Drug Allergies  Review of Systems       see ob flowsheet  Physical Exam  General:  Well-developed,well-nourished,in no acute distress; alert,appropriate and cooperative throughout examination Lungs:  normal respiratory effort, no intercostal retractions, and no accessory muscle use.   Heart:  normal rate and regular rhythm.   Abdomen:  soft and non-tender.   Genitalia:   Pelvic Exam:        External: normal female genitalia without lesions or masses        Vagina: normal without lesions or masses        Cervix: normal without lesions or masses        Adnexa: normal bimanual exam without masses or fullness        Uterus:appropriate size for gestational age        Pap smear: performed Extremities:  no edema Skin:  Intact without suspicious lesions or rashes Psych:  Oriented X3, normally interactive, and good eye contact.     Impression & Recommendations:  Problem # 1:  PREGNANCY, NORMAL, MULTIGRAVIDA (ICD-V22.1) Pt is a 30 year old [id number] who presents for New OB visit at 12.3 weeks.  EDC of 02/17/11 based on  LMP of 05/13/10 and confirmed with 8 week u/s.   Lab work reviewed- WNL.  Pt found to have a 1 hour Glucola of 193.  Pt also with first pregnancy had a loss at 21 weeks followed by 3 normal vaginal births.  pt will be referred to high risk clinic for care 2/2 dignosis of hyperglycemia/diabetes and 2/2 hx of pregnancy loss at 21 weeks. Will forward all new ob labs and today's office notes to [facility] high risk clinic.   Orders: Other OB visit- [facility] (OBCK) Glucose 1 hr-[facility] (45409) Obstetric  Referral (Obstetric)  Problem # 2:  HYPERGLYCEMIA (ICD-790.29)  193 on 1hour glucola- type 2 diabetes most likely diagnosis.  will refer to high risk clinic for management see #1.  Orders: Obstetric Referral (Obstetric)  Complete Medication List: 1)  Rhinocort Aqua 32 Mcg/act Susp (Budesonide) .... 2 sprays each nostril daily x 10 days, then as needed for congestion 2)  Prenatal Vitamins 0.8 Mg Tabs (Prenatal multivit-min-fe-fa) .... Take 1 tab by mouth daily  Other Orders: Pap Smear-[facility] ([id number]) GC/Chlamydia-[facility] (87591/87491) Wet Prep- [facility] (47829)  Patient Instructions: 1)  regresa en un mes por su proxima cita. 2)  vamos a llamar a usted [facility].    Orders Added: 1)  Pap Smear-[facility] [[id number]] 2)  GC/Chlamydia-[facility]  [87591/87491] 3)  Wet Prep- [facility] [87210] 4)  Other OB visit- [facility] [OBCK] 5)  Glucose 1 hr-[facility] [82950] 6)  Obstetric Referral [Obstetric]     OB Initial Intake Information    Positive HCG by: self    Race: Hispanic    Marital status: Married    Occupation: homemaker    Number of children at home: 3  FOB Information    Husband/Father of baby: Husband - Bartholome Bill    FOB occupation Unemployed - Surveyor, mining    FOB Comments: Father of all children  Menstrual History    LMP (date): 05/13/2010    EDC by LMP: 02/17/2011    Best Working EDC: 02/17/2011    LMP - Character: normal    Menarche: 9 years    Menses interval: 28 days    Menstrual flow 4 days    On BCP's at conception: no    Date of positive (+) home preg. test: 07/27/2010    Pre Pregnancy Weight: 149 lbs.    Symptoms since LMP: nausea, fatigue, urinary frequency   Flowsheet View for Follow-up Visit    Estimated weeks of       gestation:     12 3/7    Weight:     155    Blood pressure:   96 / 52    Hx headache?     few    Nausea/vomiting?   nausea    Edema?     0    Bleeding?     yes    Leakage/discharge?   d/c    Fetal activity:       no    Labor symptoms?   no    FHR:       150's    Taking Vitamins?   Y    Smoking PPD:   n/a    Next visit:     [facility]  Prenatal Visit EDC Confirmation:    New working [facility]: 02/17/2011    Last menses onset (LMP) date: 05/13/2010    EDC by LMP: 02/17/2011   Past Pregnancy History    Gravida:     5    Term Births:     3    Premature Births:   1    Living Children:   3    Para:       3    Mult. Births:     0    Prev C-Section:   0    Aborta:     0    Elect. Ab:     0    Spont. Ab:     0    Ectopics:     0  Pregnancy # 1    Delivery date:     03/02/2002    Weeks Gestation:   23    Preterm labor:     yes    Delivery type:     NSVD    Delivery location:     [facility]    Infant Sex:     Female    Comments:     Preterm labor - didn't  survive- was [redacted] weeks along  Pregnancy # 2    Delivery date:     05/16/2004    Weeks Gestation:   41    Delivery type:     NSVD    Anesthesia type:     epidural    Delivery location:     [facility]    Infant Sex:     Female    Birth weight:     7#    Name:     Lorre Nick    Comments:     Induced for post dates  Pregnancy # 3    Delivery date:     08/20/2005    Weeks Gestation:   39    Delivery type:     NSVD    Anesthesia type:     epidural    Delivery location:     [facility]    Infant Sex:     Female    Birth weight:     8#    Name:     Ignacia Bayley  Pregnancy # 4    Delivery date:     03/28/2009    Weeks Gestation:   41    Preterm labor:     no    Delivery type:     NSVD    Anesthesia type:     epidural    [facility]    Infant Sex:     Female    Birth weight:     7lbs    Name:     Victorio Palm   Pregnancy # 5    Delivery date:     02/17/2011    Comments:     current pregnancy   Genetic History    Father of baby:   Husband - Dellie Burns Rangel     Thalassemia:         father: no    Neural tube defect:       father: no    Down's Syndrome:       father: no    Tay-Sachs:         father: no    Sickle Cell Dz/Trait:       father: no    Hemophilia:         father: no    Muscular Dystrophy:       father: no    Cystic Fibrosis:       father: no    Huntington's Dz:       father: no    Mental Retardation:       father: no    Fragile X:         father: no    Other Genetic or       Chromosomal Dz:       father: no    Child with other       birth defect:         father: no  Infection Risk History    Rash, Viral, or Febrile Illness since LMP: yes  Chicken Pox Immune Status: Hx of Disease: Immune    History of Parvovirus (Fifth Disease): no  Environmental Exposures    Xray Exposure since LMP: no    Chemical or other exposure: no   Flowsheet View for Follow-up Visit    Estimated weeks of       gestation:     12 3/7    Weight:     155    Blood pressure:   96 / 52     Headache:     few    Nausea/vomiting:   nausea    Edema:     0    Vaginal bleeding:   yes    Vaginal discharge:   d/c    FHR:       150's    Fetal activity:     no    Labor symptoms:   no    Taking prenatal vits?   Y    Smoking:     n/a    Next visit:     [facility]  Laboratory Results  Date/Time Received: August 08, 2010 2:44 PM  Date/Time Reported: August 08, 2010 2:55 PM   [employer] Source: vag WBC/hpf: >20 Bacteria/hpf: 2+  Cocci Clue cells/hpf: few  Positive whiff Yeast/hpf: none Trichomonas/hpf: none Comments: ...............test performed by......Marland KitchenBonnie A. [place], MLS (ASCP)cm      Appended Document: NOB visit no red flags identified on pregnancy center medical home questionaire form-  PHQ-9 score of 1.  No concerns for depression at this time.   Appended Document: NOB visit Chart reviewed.  By history, patient appears to be Z6X0960 (preterm delivery with subsequent neonatal death), history of GDM with past pregnancy, now with failed 1hrGTT.  For referral to [facility].  Prenatal record sent to [facility] via fax.

## 2010-09-05 ENCOUNTER — Other Ambulatory Visit: Payer: Self-pay

## 2010-09-05 ENCOUNTER — Encounter: Payer: Self-pay | Attending: Obstetrics & Gynecology | Admitting: Dietician

## 2010-09-05 ENCOUNTER — Other Ambulatory Visit: Payer: Self-pay | Admitting: Obstetrics & Gynecology

## 2010-09-05 DIAGNOSIS — O24919 Unspecified diabetes mellitus in pregnancy, unspecified trimester: Secondary | ICD-10-CM

## 2010-09-05 DIAGNOSIS — Z713 Dietary counseling and surveillance: Secondary | ICD-10-CM | POA: Insufficient documentation

## 2010-09-05 DIAGNOSIS — O99810 Abnormal glucose complicating pregnancy: Secondary | ICD-10-CM | POA: Insufficient documentation

## 2010-09-05 LAB — POCT URINALYSIS DIPSTICK
Ketones, ur: NEGATIVE mg/dL
Protein, ur: NEGATIVE mg/dL
Urobilinogen, UA: 0.2 mg/dL (ref 0.0–1.0)
pH: 5 (ref 5.0–8.0)

## 2010-09-06 ENCOUNTER — Encounter: Payer: Self-pay | Admitting: *Deleted

## 2010-09-12 ENCOUNTER — Other Ambulatory Visit: Payer: Self-pay

## 2010-09-12 DIAGNOSIS — O99810 Abnormal glucose complicating pregnancy: Secondary | ICD-10-CM

## 2010-09-12 LAB — POCT URINALYSIS DIPSTICK
Bilirubin Urine: NEGATIVE
Hgb urine dipstick: NEGATIVE
Ketones, ur: NEGATIVE mg/dL
pH: 5.5 (ref 5.0–8.0)

## 2010-09-19 ENCOUNTER — Ambulatory Visit (HOSPITAL_COMMUNITY)
Admission: RE | Admit: 2010-09-19 | Discharge: 2010-09-19 | Disposition: A | Discharge status: Home / Self Care | Payer: Self-pay | Source: Ambulatory Visit | Attending: Obstetrics & Gynecology | Admitting: Obstetrics & Gynecology

## 2010-09-19 ENCOUNTER — Encounter (HOSPITAL_COMMUNITY): Payer: Self-pay

## 2010-09-19 DIAGNOSIS — O99810 Abnormal glucose complicating pregnancy: Secondary | ICD-10-CM | POA: Insufficient documentation

## 2010-09-19 DIAGNOSIS — Z1389 Encounter for screening for other disorder: Secondary | ICD-10-CM | POA: Insufficient documentation

## 2010-09-19 DIAGNOSIS — O4400 Placenta previa specified as without hemorrhage, unspecified trimester: Secondary | ICD-10-CM | POA: Insufficient documentation

## 2010-09-19 DIAGNOSIS — O358XX0 Maternal care for other (suspected) fetal abnormality and damage, not applicable or unspecified: Secondary | ICD-10-CM | POA: Insufficient documentation

## 2010-09-19 DIAGNOSIS — Z363 Encounter for antenatal screening for malformations: Secondary | ICD-10-CM | POA: Insufficient documentation

## 2010-10-03 ENCOUNTER — Other Ambulatory Visit: Payer: Self-pay

## 2010-10-03 ENCOUNTER — Other Ambulatory Visit: Payer: Self-pay | Admitting: Family Medicine

## 2010-10-03 DIAGNOSIS — O99810 Abnormal glucose complicating pregnancy: Secondary | ICD-10-CM

## 2010-10-03 LAB — POCT URINALYSIS DIPSTICK
Ketones, ur: NEGATIVE mg/dL
Protein, ur: NEGATIVE mg/dL
Specific Gravity, Urine: >=1.03 (ref 1.005–1.030)
pH: 5.5 (ref 5.0–8.0)

## 2010-10-10 LAB — CBC
MCH: 30 pg (ref 26.0–34.0)
MCV: 88 fL (ref 78.0–100.0)
Platelets: 262 10*3/uL (ref 150–400)
RBC: 4.25 MIL/uL (ref 3.87–5.11)

## 2010-10-10 LAB — URINALYSIS, ROUTINE W REFLEX MICROSCOPIC
Glucose, UA: NEGATIVE mg/dL
Protein, ur: NEGATIVE mg/dL
Specific Gravity, Urine: >1.03 — ABNORMAL HIGH (ref 1.005–1.030)
pH: 6 (ref 5.0–8.0)

## 2010-10-10 LAB — URINE MICROSCOPIC-ADD ON

## 2010-10-10 LAB — WET PREP, GENITAL: Yeast Wet Prep HPF POC: NONE SEEN

## 2010-10-24 ENCOUNTER — Other Ambulatory Visit: Payer: Self-pay | Admitting: Obstetrics and Gynecology

## 2010-10-24 ENCOUNTER — Encounter: Payer: Self-pay | Attending: Obstetrics & Gynecology | Admitting: Dietician

## 2010-10-24 DIAGNOSIS — Z331 Pregnant state, incidental: Secondary | ICD-10-CM

## 2010-10-24 DIAGNOSIS — Z713 Dietary counseling and surveillance: Secondary | ICD-10-CM | POA: Insufficient documentation

## 2010-10-24 DIAGNOSIS — O99810 Abnormal glucose complicating pregnancy: Secondary | ICD-10-CM

## 2010-10-24 LAB — POCT URINALYSIS DIP (DEVICE)
Bilirubin Urine: NEGATIVE
Ketones, ur: NEGATIVE mg/dL
Specific Gravity, Urine: >=1.03 (ref 1.005–1.030)
pH: 6.5 (ref 5.0–8.0)

## 2010-10-31 ENCOUNTER — Ambulatory Visit (HOSPITAL_COMMUNITY)
Admission: RE | Admit: 2010-10-31 | Discharge: 2010-10-31 | Disposition: A | Discharge status: Home / Self Care | Payer: Self-pay | Source: Ambulatory Visit | Attending: Family Medicine | Admitting: Family Medicine

## 2010-10-31 DIAGNOSIS — O99810 Abnormal glucose complicating pregnancy: Secondary | ICD-10-CM | POA: Insufficient documentation

## 2010-10-31 DIAGNOSIS — Z3689 Encounter for other specified antenatal screening: Secondary | ICD-10-CM | POA: Insufficient documentation

## 2010-10-31 DIAGNOSIS — O4400 Placenta previa specified as without hemorrhage, unspecified trimester: Secondary | ICD-10-CM | POA: Insufficient documentation

## 2010-11-05 LAB — POCT URINALYSIS DIP (DEVICE)
Bilirubin Urine: NEGATIVE
Bilirubin Urine: NEGATIVE
Bilirubin Urine: NEGATIVE
Bilirubin Urine: NEGATIVE
Glucose, UA: NEGATIVE mg/dL
Glucose, UA: NEGATIVE mg/dL
Hgb urine dipstick: NEGATIVE
Ketones, ur: NEGATIVE mg/dL
Ketones, ur: NEGATIVE mg/dL
Ketones, ur: NEGATIVE mg/dL
Ketones, ur: NEGATIVE mg/dL
Nitrite: NEGATIVE
Protein, ur: NEGATIVE mg/dL
Protein, ur: NEGATIVE mg/dL
Specific Gravity, Urine: <=1.005 (ref 1.005–1.030)
Specific Gravity, Urine: 1.025 (ref 1.005–1.030)
pH: 5 (ref 5.0–8.0)
pH: 7 (ref 5.0–8.0)
pH: 7 (ref 5.0–8.0)

## 2010-11-05 LAB — CBC
HCT: 35.7 % — ABNORMAL LOW (ref 36.0–46.0)
Hemoglobin: 12 g/dL (ref 12.0–15.0)
MCV: 86.5 fL (ref 78.0–100.0)
Platelets: 315 10*3/uL (ref 150–400)
RDW: 14.6 % (ref 11.5–15.5)
WBC: 8.9 10*3/uL (ref 4.0–10.5)

## 2010-11-05 LAB — GLUCOSE, CAPILLARY

## 2010-11-06 LAB — POCT URINALYSIS DIP (DEVICE)
Bilirubin Urine: NEGATIVE
Bilirubin Urine: NEGATIVE
Bilirubin Urine: NEGATIVE
Bilirubin Urine: NEGATIVE
Glucose, UA: NEGATIVE mg/dL
Glucose, UA: NEGATIVE mg/dL
Glucose, UA: NEGATIVE mg/dL
Glucose, UA: NEGATIVE mg/dL
Hgb urine dipstick: NEGATIVE
Hgb urine dipstick: NEGATIVE
Hgb urine dipstick: NEGATIVE
Hgb urine dipstick: NEGATIVE
Ketones, ur: NEGATIVE mg/dL
Ketones, ur: NEGATIVE mg/dL
Ketones, ur: NEGATIVE mg/dL
Ketones, ur: NEGATIVE mg/dL
Ketones, ur: NEGATIVE mg/dL
Nitrite: NEGATIVE
Nitrite: NEGATIVE
Protein, ur: NEGATIVE mg/dL
Protein, ur: NEGATIVE mg/dL
Specific Gravity, Urine: 1.015 (ref 1.005–1.030)
Specific Gravity, Urine: 1.025 (ref 1.005–1.030)
Specific Gravity, Urine: 1.01 (ref 1.005–1.030)
Specific Gravity, Urine: 1.015 (ref 1.005–1.030)
pH: 7 (ref 5.0–8.0)
pH: 7 (ref 5.0–8.0)
pH: 6.5 (ref 5.0–8.0)

## 2010-11-07 ENCOUNTER — Other Ambulatory Visit: Payer: Self-pay | Admitting: Obstetrics and Gynecology

## 2010-11-07 DIAGNOSIS — Z331 Pregnant state, incidental: Secondary | ICD-10-CM

## 2010-11-07 DIAGNOSIS — O99810 Abnormal glucose complicating pregnancy: Secondary | ICD-10-CM

## 2010-11-07 DIAGNOSIS — O4400 Placenta previa specified as without hemorrhage, unspecified trimester: Secondary | ICD-10-CM

## 2010-11-07 LAB — POCT URINALYSIS DIP (DEVICE)
Bilirubin Urine: NEGATIVE
Glucose, UA: NEGATIVE mg/dL
Glucose, UA: NEGATIVE mg/dL
Hgb urine dipstick: NEGATIVE
Hgb urine dipstick: NEGATIVE
Hgb urine dipstick: NEGATIVE
Hgb urine dipstick: NEGATIVE
Hgb urine dipstick: NEGATIVE
Ketones, ur: NEGATIVE mg/dL
Ketones, ur: NEGATIVE mg/dL
Ketones, ur: NEGATIVE mg/dL
Protein, ur: NEGATIVE mg/dL
Protein, ur: NEGATIVE mg/dL
Protein, ur: NEGATIVE mg/dL
Specific Gravity, Urine: 1.025 (ref 1.005–1.030)
Specific Gravity, Urine: 1.02 (ref 1.005–1.030)
Specific Gravity, Urine: 1.02 (ref 1.005–1.030)
Specific Gravity, Urine: 1.02 (ref 1.005–1.030)
Urobilinogen, UA: 0.2 mg/dL (ref 0.0–1.0)
pH: 6.5 (ref 5.0–8.0)
pH: 5.5 (ref 5.0–8.0)
pH: 6 (ref 5.0–8.0)
pH: 5.5 (ref 5.0–8.0)
pH: 6.5 (ref 5.0–8.0)

## 2010-11-07 LAB — GLUCOSE, CAPILLARY: Glucose-Capillary: 96 mg/dL (ref 70–99)

## 2010-11-08 LAB — WOUND CULTURE: Culture: NO GROWTH

## 2010-11-08 LAB — BASIC METABOLIC PANEL
BUN: 5 mg/dL — ABNORMAL LOW (ref 6–23)
Chloride: 109 mEq/L (ref 96–112)
GFR calc non Af Amer: >60 mL/min (ref >60)
Glucose, Bld: 82 mg/dL (ref 70–99)
Potassium: 4.2 mEq/L (ref 3.5–5.1)
Sodium: 136 mEq/L (ref 135–145)

## 2010-11-08 LAB — POCT URINALYSIS DIP (DEVICE)
Bilirubin Urine: NEGATIVE
Bilirubin Urine: NEGATIVE
Glucose, UA: NEGATIVE mg/dL
Hgb urine dipstick: NEGATIVE
Ketones, ur: NEGATIVE mg/dL
Ketones, ur: NEGATIVE mg/dL
Nitrite: NEGATIVE
Protein, ur: NEGATIVE mg/dL
Specific Gravity, Urine: 1.02 (ref 1.005–1.030)
pH: 6.5 (ref 5.0–8.0)
pH: 7 (ref 5.0–8.0)
pH: 7 (ref 5.0–8.0)

## 2010-11-08 LAB — DIFFERENTIAL
Basophils Absolute: 0 10*3/uL (ref 0.0–0.1)
Basophils Relative: 0 % (ref 0–1)
Eosinophils Absolute: 0.2 10*3/uL (ref 0.0–0.7)
Eosinophils Absolute: 0.1 10*3/uL (ref 0.0–0.7)
Eosinophils Relative: 3 % (ref 0–5)
Eosinophils Relative: 2 % (ref 0–5)
Lymphocytes Relative: 23 % (ref 12–46)
Lymphs Abs: 1.7 10*3/uL (ref 0.7–4.0)
Monocytes Absolute: 0.4 10*3/uL (ref 0.1–1.0)

## 2010-11-08 LAB — CBC
HCT: 33.3 % — ABNORMAL LOW (ref 36.0–46.0)
HCT: 33.5 % — ABNORMAL LOW (ref 36.0–46.0)
Hemoglobin: 11.5 g/dL — ABNORMAL LOW (ref 12.0–15.0)
MCV: 87.2 fL (ref 78.0–100.0)
MCV: 86.9 fL (ref 78.0–100.0)
Platelets: 268 10*3/uL (ref 150–400)
Platelets: 268 10*3/uL (ref 150–400)
RDW: 14 % (ref 11.5–15.5)
RDW: 13.9 % (ref 11.5–15.5)
WBC: 7.8 10*3/uL (ref 4.0–10.5)

## 2010-11-08 LAB — ANAEROBIC CULTURE

## 2010-11-08 LAB — AFB CULTURE WITH SMEAR (NOT AT ARMC)

## 2010-11-09 LAB — POCT URINALYSIS DIP (DEVICE)
Bilirubin Urine: NEGATIVE
Bilirubin Urine: NEGATIVE
Bilirubin Urine: NEGATIVE
Glucose, UA: NEGATIVE mg/dL
Glucose, UA: NEGATIVE mg/dL
Hgb urine dipstick: NEGATIVE
Hgb urine dipstick: NEGATIVE
Ketones, ur: NEGATIVE mg/dL
Ketones, ur: NEGATIVE mg/dL
Specific Gravity, Urine: >=1.03 (ref 1.005–1.030)
Specific Gravity, Urine: 1.02 (ref 1.005–1.030)
pH: 7.5 (ref 5.0–8.0)

## 2010-11-10 LAB — POCT URINALYSIS DIP (DEVICE)
Bilirubin Urine: NEGATIVE
Glucose, UA: NEGATIVE mg/dL
Hgb urine dipstick: NEGATIVE
Ketones, ur: NEGATIVE mg/dL
Ketones, ur: NEGATIVE mg/dL
Protein, ur: NEGATIVE mg/dL
Specific Gravity, Urine: 1.025 (ref 1.005–1.030)
Specific Gravity, Urine: <=1.005 (ref 1.005–1.030)
Urobilinogen, UA: 0.2 mg/dL (ref 0.0–1.0)
pH: 6 (ref 5.0–8.0)

## 2010-11-21 ENCOUNTER — Other Ambulatory Visit: Payer: Self-pay | Admitting: Obstetrics and Gynecology

## 2010-11-21 DIAGNOSIS — Z331 Pregnant state, incidental: Secondary | ICD-10-CM

## 2010-11-21 DIAGNOSIS — O99810 Abnormal glucose complicating pregnancy: Secondary | ICD-10-CM

## 2010-11-21 LAB — POCT URINALYSIS DIP (DEVICE)
Glucose, UA: NEGATIVE mg/dL
Hgb urine dipstick: NEGATIVE
Ketones, ur: NEGATIVE mg/dL
Specific Gravity, Urine: 1.025 (ref 1.005–1.030)
Urobilinogen, UA: 0.2 mg/dL (ref 0.0–1.0)

## 2010-11-28 ENCOUNTER — Other Ambulatory Visit: Payer: Self-pay | Admitting: Obstetrics & Gynecology

## 2010-11-28 DIAGNOSIS — O99810 Abnormal glucose complicating pregnancy: Secondary | ICD-10-CM

## 2010-11-28 DIAGNOSIS — O99119 Other diseases of the blood and blood-forming organs and certain disorders involving the immune mechanism complicating pregnancy, unspecified trimester: Secondary | ICD-10-CM

## 2010-11-28 DIAGNOSIS — D689 Coagulation defect, unspecified: Secondary | ICD-10-CM

## 2010-11-28 DIAGNOSIS — O4400 Placenta previa specified as without hemorrhage, unspecified trimester: Secondary | ICD-10-CM

## 2010-11-28 DIAGNOSIS — Z331 Pregnant state, incidental: Secondary | ICD-10-CM

## 2010-11-28 LAB — POCT URINALYSIS DIP (DEVICE)
Bilirubin Urine: NEGATIVE
Glucose, UA: NEGATIVE mg/dL
Hgb urine dipstick: NEGATIVE
Ketones, ur: NEGATIVE mg/dL
Nitrite: NEGATIVE
Specific Gravity, Urine: 1.025 (ref 1.005–1.030)
pH: 6 (ref 5.0–8.0)

## 2010-12-05 ENCOUNTER — Other Ambulatory Visit: Payer: Self-pay | Admitting: Obstetrics & Gynecology

## 2010-12-05 DIAGNOSIS — Z331 Pregnant state, incidental: Secondary | ICD-10-CM

## 2010-12-05 DIAGNOSIS — O24919 Unspecified diabetes mellitus in pregnancy, unspecified trimester: Secondary | ICD-10-CM

## 2010-12-05 LAB — POCT URINALYSIS DIP (DEVICE)
Bilirubin Urine: NEGATIVE
Glucose, UA: NEGATIVE mg/dL
Hgb urine dipstick: NEGATIVE
Ketones, ur: NEGATIVE mg/dL
Specific Gravity, Urine: >=1.03 (ref 1.005–1.030)
pH: 6.5 (ref 5.0–8.0)

## 2010-12-13 NOTE — Op Note (Signed)
NAMECARAGH, GASPER               ACCOUNT NO.:  [id number]   MEDICAL RECORD NO.:  [id number]          PATIENT TYPE:  AMB   LOCATION:  SDS                          FACILITY:  [facility]   PHYSICIAN:  Alfonse Ras, MD   DATE OF BIRTH:  Oct 13, 1980   DATE OF PROCEDURE:  11/01/2007  DATE OF DISCHARGE:  11/01/2007                               OPERATIVE REPORT   PREOPERATIVE DIAGNOSIS:  Left breast mass, chronic with probable  infection.   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   PROCEDURE:  Left breast incision, debridement, and partial mastectomy.   ANESTHESIA:  General.   SURGEON:  Alfonse Ras, MD   DESCRIPTION:  The patient was taken to the operating room, placed in  supine position.  After adequate anesthesia was induced using laryngeal  mask, the left breast was prepped and draped in the normal sterile  fashion.  Using a curvilinear incision over the very firm area in the  upper inner quadrant of the left breast, I dissected down on to a very  firm fibrotic area with multiple small areas of abscess formation.  Cultures were sent.  This was taken all the way down to the pectoralis  fascia and both superior, lateral, and medial directions.  The area was  quite hypervascular and was controlled with coagulation.  Adequate  hemostasis was ensured.  This subcutaneous tissue was closed with  interrupted 3-0 Vicryl.  Skin was closed with subcuticular  3-0  Monocryl.  Steri-Strips and sterile dressings were applied.  The patient  tolerated the procedure well and went to PACU in good condition.      Alfonse Ras, MD  Electronically Signed     KRE/MEDQ  D:  11/01/2007  T:  11/01/2007  Job:  [id number]

## 2010-12-13 NOTE — Op Note (Signed)
NAMEKEELY, DRENNAN        ACCOUNT NO.:  [id number]   MEDICAL RECORD NO.:  [id number]           PATIENT TYPE:   LOCATION:                                 FACILITY:   PHYSICIAN:  Almond Lint, MD       DATE OF BIRTH:  1980-12-17   DATE OF PROCEDURE:  DATE OF DISCHARGE:                               OPERATIVE REPORT   PREOPERATIVE DIAGNOSIS:  Right breast abscess.   POSTOPERATIVE DIAGNOSES:  1. Right breast abscess.  2. Right breast mass.   PROCEDURE PERFORMED:  Incision and drainage of right breast abscess and  biopsy of right breast.   ANESTHESIA:  General and local.   SURGEON:  Almond Lint, MD   ASSISTANT:  None.   ESTIMATED BLOOD LOSS:  Minimal.   FINDINGS:  Murky serous fluid.  Some loculations.  Firm area inferior to  the abscess cavity.   DESCRIPTION OF PROCEDURE:  Ms. Gracy fetus was evaluated with fetal  heart rate monitoring pre and post op with good fetal heart tones. Mrs.  Effertz was identified in the holding area, taken to the operating  room, and placed supine on the operating room table.  General anesthesia  was induced.  The right breast was prepped and draped in sterile  fashion.  A time-out was performed according to the surgical safety  check list.  When all was correct, we continued.  A radial incision was  made at 9 o'clock on the right breast.  A moderate amount of purulent  drainage was encountered and cultured.  Several loculations were broken  up in the abscess cavity.  A mass was adjacent to the cavity, and this  was removed with the bovie and sent for pathology.  The cavity was  irrigated and inspected for hemostasis.  Electrocautery was used to  touch up several small bleeding capillaries.  The cavity was packed with  nu-gauze and dressed with gauze and tape.  The patient was awakened from  anesthesia and taken to the PACU in stable condition.      Almond Lint, MD  Electronically Signed     FB/MEDQ  D:  12/25/2008  T:   12/25/2008  Job:  [id number]

## 2010-12-16 NOTE — Discharge Summary (Signed)
   NAMEGALADRIEL, SHROFF                           ACCOUNT NO.:  [id number]   MEDICAL RECORD NO.:  [id number]                   PATIENT TYPE:  INP   LOCATION:  9306                                 FACILITY:  [facility]   PHYSICIAN:  Maurice March, M.D.                  DATE OF BIRTH:  1981/04/22   DATE OF ADMISSION:  03/04/2003  DATE OF DISCHARGE:  03/05/2003                                 DISCHARGE SUMMARY   ADMISSION DIAGNOSIS:  A 30 year old G1 at 22-4/7 with detected intrauterine  fetal demise.   DISCHARGE DIAGNOSES:  10. A 30 year old G1, P0-1-0-0, postpartum day zero from intrauterine fetal     demise.  2. Normal disseminated intravascular coagulation panel.  3. Appropriate grieving process.   DISCHARGE MEDICATIONS:  1. Ambien 10 mg 1 p.o. q.h.s. p.r.n., #10.  2. Ibuprofen 600 mg 1 p.o. q. 6 hours p.r.n., #20.   HOSPITAL COURSE:  Ms. Tetro is a 30 year old G1 who presented at 7 and  3/7 weeks initially from her primary OB/GYN. In their office, an ultrasound  was done which showed fetal demise. At that time,  Ms. Campoverde was unable to accept the fact that she had an IUFD. She went  home to grieve overnight and presented the next day. On admission at 22-4/7  weeks, she was admitted for cervical ripening. Cytotec was given. A  nonviable fetus was delivered. The placenta was grossly abnormal and sent to  pathology. Pastoral care was consulted to baptize the baby and give  emotional and religious comfort. The afternoon of the delivery, the patient  requested to be discharged to home. She was stable with no pain and no  bleeding. She was discharged home.   DISCHARGE INSTRUCTIONS:  They were told of their medical regimen as well as  to follow up in GYN clinic on March 16, 2003. At that time, her anti-  phospholipid panel will need to be reviewed with the patient including anti-  phospholipid antibody.                                               Maurice March, M.D.    LC/MEDQ   D:  03/05/2003  T:  03/05/2003  Job:  [id number]   cc:   [facility]necology [facility]

## 2010-12-19 ENCOUNTER — Other Ambulatory Visit: Payer: Self-pay | Admitting: Family Medicine

## 2010-12-19 ENCOUNTER — Other Ambulatory Visit: Payer: Self-pay | Admitting: Obstetrics and Gynecology

## 2010-12-19 DIAGNOSIS — O99810 Abnormal glucose complicating pregnancy: Secondary | ICD-10-CM

## 2010-12-19 DIAGNOSIS — O4400 Placenta previa specified as without hemorrhage, unspecified trimester: Secondary | ICD-10-CM

## 2010-12-19 DIAGNOSIS — Z331 Pregnant state, incidental: Secondary | ICD-10-CM

## 2010-12-19 DIAGNOSIS — O24419 Gestational diabetes mellitus in pregnancy, unspecified control: Secondary | ICD-10-CM

## 2010-12-19 LAB — POCT URINALYSIS DIP (DEVICE)
Bilirubin Urine: NEGATIVE
Hgb urine dipstick: NEGATIVE
Ketones, ur: NEGATIVE mg/dL
Protein, ur: NEGATIVE mg/dL
Specific Gravity, Urine: 1.02 (ref 1.005–1.030)
pH: 6 (ref 5.0–8.0)

## 2010-12-23 ENCOUNTER — Ambulatory Visit (HOSPITAL_COMMUNITY)
Admission: RE | Admit: 2010-12-23 | Discharge: 2010-12-23 | Disposition: A | Discharge status: Home / Self Care | Payer: Self-pay | Source: Ambulatory Visit | Attending: Family Medicine | Admitting: Family Medicine

## 2010-12-23 DIAGNOSIS — O99810 Abnormal glucose complicating pregnancy: Secondary | ICD-10-CM | POA: Insufficient documentation

## 2010-12-23 DIAGNOSIS — Z3689 Encounter for other specified antenatal screening: Secondary | ICD-10-CM | POA: Insufficient documentation

## 2010-12-23 DIAGNOSIS — O4400 Placenta previa specified as without hemorrhage, unspecified trimester: Secondary | ICD-10-CM | POA: Insufficient documentation

## 2010-12-23 DIAGNOSIS — O24419 Gestational diabetes mellitus in pregnancy, unspecified control: Secondary | ICD-10-CM

## 2010-12-26 ENCOUNTER — Inpatient Hospital Stay (HOSPITAL_COMMUNITY)
Admission: AD | Admit: 2010-12-26 | Discharge: 2011-01-02 | DRG: 781 | Disposition: A | Discharge status: Home / Self Care | Payer: Medicaid Other | Source: Ambulatory Visit | Attending: Obstetrics and Gynecology | Admitting: Obstetrics and Gynecology

## 2010-12-26 ENCOUNTER — Inpatient Hospital Stay (HOSPITAL_COMMUNITY): Payer: Medicaid Other

## 2010-12-26 ENCOUNTER — Other Ambulatory Visit: Payer: Self-pay | Admitting: Family Medicine

## 2010-12-26 DIAGNOSIS — O99810 Abnormal glucose complicating pregnancy: Secondary | ICD-10-CM

## 2010-12-26 DIAGNOSIS — O4410 Placenta previa with hemorrhage, unspecified trimester: Secondary | ICD-10-CM

## 2010-12-26 LAB — CBC
HCT: 33.9 % — ABNORMAL LOW (ref 36.0–46.0)
Hemoglobin: 11 g/dL — ABNORMAL LOW (ref 12.0–15.0)
MCHC: 32.4 g/dL (ref 30.0–36.0)
MCV: 87.8 fL (ref 78.0–100.0)
RDW: 14.1 % (ref 11.5–15.5)

## 2010-12-26 LAB — WET PREP, GENITAL
Clue Cells Wet Prep HPF POC: NONE SEEN
Trich, Wet Prep: NONE SEEN

## 2010-12-27 LAB — GLUCOSE, CAPILLARY
Glucose-Capillary: 102 mg/dL — ABNORMAL HIGH (ref 70–99)
Glucose-Capillary: 87 mg/dL (ref 70–99)
Glucose-Capillary: 110 mg/dL — ABNORMAL HIGH (ref 70–99)

## 2010-12-27 LAB — GC/CHLAMYDIA PROBE AMP, GENITAL
Chlamydia, DNA Probe: NEGATIVE
GC Probe Amp, Genital: NEGATIVE

## 2010-12-28 LAB — GLUCOSE, CAPILLARY
Glucose-Capillary: 105 mg/dL — ABNORMAL HIGH (ref 70–99)
Glucose-Capillary: 95 mg/dL (ref 70–99)

## 2010-12-29 LAB — GLUCOSE, CAPILLARY
Glucose-Capillary: 86 mg/dL (ref 70–99)
Glucose-Capillary: 108 mg/dL — ABNORMAL HIGH (ref 70–99)
Glucose-Capillary: 73 mg/dL (ref 70–99)

## 2010-12-30 LAB — GLUCOSE, CAPILLARY
Glucose-Capillary: 85 mg/dL (ref 70–99)
Glucose-Capillary: 100 mg/dL — ABNORMAL HIGH (ref 70–99)

## 2010-12-30 LAB — CROSSMATCH
ABO/RH(D): O POS
Antibody Screen: NEGATIVE
Unit division: 0
Unit division: 0
Unit division: 0

## 2010-12-31 LAB — GLUCOSE, CAPILLARY
Glucose-Capillary: 67 mg/dL — ABNORMAL LOW (ref 70–99)
Glucose-Capillary: 76 mg/dL (ref 70–99)
Glucose-Capillary: 87 mg/dL (ref 70–99)

## 2011-01-01 LAB — GLUCOSE, CAPILLARY: Glucose-Capillary: 90 mg/dL (ref 70–99)

## 2011-01-02 LAB — GLUCOSE, CAPILLARY: Glucose-Capillary: 95 mg/dL (ref 70–99)

## 2011-01-05 ENCOUNTER — Other Ambulatory Visit: Payer: Self-pay

## 2011-01-05 DIAGNOSIS — Z302 Encounter for sterilization: Secondary | ICD-10-CM

## 2011-01-05 DIAGNOSIS — O4400 Placenta previa specified as without hemorrhage, unspecified trimester: Secondary | ICD-10-CM

## 2011-01-05 DIAGNOSIS — O99810 Abnormal glucose complicating pregnancy: Secondary | ICD-10-CM

## 2011-01-07 ENCOUNTER — Inpatient Hospital Stay (HOSPITAL_COMMUNITY)
Admission: AD | Admit: 2011-01-07 | Discharge: 2011-01-18 | DRG: 765 | Disposition: A | Discharge status: Home / Self Care | Payer: Medicaid Other | Source: Ambulatory Visit | Attending: Obstetrics & Gynecology | Admitting: Obstetrics & Gynecology

## 2011-01-07 DIAGNOSIS — O99814 Abnormal glucose complicating childbirth: Secondary | ICD-10-CM | POA: Diagnosis present

## 2011-01-07 DIAGNOSIS — Z302 Encounter for sterilization: Secondary | ICD-10-CM

## 2011-01-07 DIAGNOSIS — O4410 Placenta previa with hemorrhage, unspecified trimester: Principal | ICD-10-CM | POA: Diagnosis present

## 2011-01-07 LAB — CBC
Hemoglobin: 10.7 g/dL — ABNORMAL LOW (ref 12.0–15.0)
MCH: 29.2 pg (ref 26.0–34.0)
MCV: 87.7 fL (ref 78.0–100.0)
Platelets: 276 10*3/uL (ref 150–400)
RBC: 3.66 MIL/uL — ABNORMAL LOW (ref 3.87–5.11)
WBC: 9 10*3/uL (ref 4.0–10.5)

## 2011-01-07 LAB — TYPE AND SCREEN: ABO/RH(D): O POS

## 2011-01-08 LAB — GLUCOSE, CAPILLARY
Glucose-Capillary: 104 mg/dL — ABNORMAL HIGH (ref 70–99)
Glucose-Capillary: 106 mg/dL — ABNORMAL HIGH (ref 70–99)
Glucose-Capillary: 109 mg/dL — ABNORMAL HIGH (ref 70–99)
Glucose-Capillary: 59 mg/dL — ABNORMAL LOW (ref 70–99)

## 2011-01-09 ENCOUNTER — Other Ambulatory Visit: Payer: Self-pay

## 2011-01-10 LAB — TYPE AND SCREEN
ABO/RH(D): O POS
Antibody Screen: NEGATIVE

## 2011-01-10 LAB — GLUCOSE, CAPILLARY
Glucose-Capillary: 62 mg/dL — ABNORMAL LOW (ref 70–99)
Glucose-Capillary: 110 mg/dL — ABNORMAL HIGH (ref 70–99)
Glucose-Capillary: 111 mg/dL — ABNORMAL HIGH (ref 70–99)

## 2011-01-11 LAB — GLUCOSE, CAPILLARY: Glucose-Capillary: 100 mg/dL — ABNORMAL HIGH (ref 70–99)

## 2011-01-12 ENCOUNTER — Other Ambulatory Visit: Payer: Self-pay

## 2011-01-12 LAB — GLUCOSE, CAPILLARY
Glucose-Capillary: 73 mg/dL (ref 70–99)
Glucose-Capillary: 106 mg/dL — ABNORMAL HIGH (ref 70–99)

## 2011-01-13 LAB — GLUCOSE, CAPILLARY
Glucose-Capillary: 74 mg/dL (ref 70–99)
Glucose-Capillary: 100 mg/dL — ABNORMAL HIGH (ref 70–99)

## 2011-01-14 ENCOUNTER — Other Ambulatory Visit: Payer: Self-pay | Admitting: Obstetrics & Gynecology

## 2011-01-14 DIAGNOSIS — O99814 Abnormal glucose complicating childbirth: Secondary | ICD-10-CM

## 2011-01-14 DIAGNOSIS — Z302 Encounter for sterilization: Secondary | ICD-10-CM

## 2011-01-14 DIAGNOSIS — O4410 Placenta previa with hemorrhage, unspecified trimester: Secondary | ICD-10-CM

## 2011-01-14 LAB — CBC
HCT: 32.9 % — ABNORMAL LOW (ref 36.0–46.0)
MCHC: 32.8 g/dL (ref 30.0–36.0)
MCV: 87.5 fL (ref 78.0–100.0)
Platelets: 224 10*3/uL (ref 150–400)
RDW: 14 % (ref 11.5–15.5)

## 2011-01-14 LAB — GLUCOSE, CAPILLARY

## 2011-01-15 LAB — CBC
Hemoglobin: 8.2 g/dL — ABNORMAL LOW (ref 12.0–15.0)
RBC: 2.78 MIL/uL — ABNORMAL LOW (ref 3.87–5.11)

## 2011-01-15 LAB — GLUCOSE, CAPILLARY
Glucose-Capillary: 101 mg/dL — ABNORMAL HIGH (ref 70–99)
Glucose-Capillary: 115 mg/dL — ABNORMAL HIGH (ref 70–99)
Glucose-Capillary: 84 mg/dL (ref 70–99)

## 2011-01-16 LAB — TYPE AND SCREEN
ABO/RH(D): O POS
Unit division: 0
Unit division: 0

## 2011-01-16 LAB — CBC
Platelets: 194 10*3/uL (ref 150–400)
RDW: 14.1 % (ref 11.5–15.5)
WBC: 8.1 10*3/uL (ref 4.0–10.5)

## 2011-01-16 LAB — RPR: RPR Ser Ql: NONREACTIVE

## 2011-01-17 LAB — CBC
HCT: 24.4 % — ABNORMAL LOW (ref 36.0–46.0)
Hemoglobin: 7.9 g/dL — ABNORMAL LOW (ref 12.0–15.0)
MCHC: 32.4 g/dL (ref 30.0–36.0)

## 2011-01-19 NOTE — Op Note (Addendum)
NAMEABRI, VACCA NO.:  [id number]  MEDICAL RECORD NO.:  [id number]  LOCATION:  126                            FACILITY:  PHYSICIAN:  Scheryl Darter, MD       DATE OF BIRTH:  1980-10-07  DATE OF PROCEDURE:  01/14/2011 DATE OF DISCHARGE:                              OPERATIVE REPORT   PREOPERATIVE DIAGNOSIS:  The patient is a 30 year old gravida 5, para 3- 1-0-3 at 41 and 1 who was noted to have placenta previa on her third bleed during hospital admission with undesired fertility.  POSTOPERATIVE DIAGNOSIS:  The patient is a 30 year old gravida 5, para 3- 1-0-3 at 75 and 1 who was noted to have placenta previa on her third bleed during hospital admission with undesired fertility.  PROCEDURE:  A primary classical cesarean section with bilateral tubal ligation with Filshie clips.  SURGEON:  Dr. Scheryl Darter, Dr. Lucina Mellow  ANESTHESIA:  Spinal.  FINDINGS:  Female infant in vertex position, Apgars of 8 at 1 minute and 9 at 5 minutes, weight 4 pounds 19 ounces.  Normal uterus, tubes, ovaries, and appendix was also visualized and noted also was placenta previa.  Cord pH was 7.27.  ESTIMATED BLOOD LOSS:  1000 mL.  FLUIDS:  1900 mL.  URINE:  100 and clear.  PROCEDURE:  The patient was taken to the operating room where spinal anesthesia was found to be adequate.  She was prepared and draped in the normal sterile fashion in the dorsal supine position with a leftward tilt.  A Pfannenstiel skin incision was then made with a scalpel and carried through to the underlying of the fascia with a Bovie and the knife.  The fascia was incised in the midline and the incision was extended laterally with Mayo scissors.  The superior aspect of the fascial incision was then grasped with the Kocher clamps, elevated, and the underlying rectus muscle was dissected off bluntly.  Attention was then turned to the inferior aspect of the incision which in a similar fashion  was grasped, tented up with Kocher clamps, and the rectus muscle dissected off bluntly.  The rectus muscles were then separated in the midline.  The peritoneum was identified, tented up, and entered with use of hemostats and Metzenbaum scissors.  The peritoneal incision was then extended superiorly and inferiorly with lateral traction.  There was good visualization of the bladder.  The bladder blade was then inserted and the vesicouterine peritoneum was identified, grasped with pickups, and entered sharply with Metzenbaum scissors.  The incision was then extended laterally and the bladder flap was created digitally.  The bladder blade was then inserted and the lower uterine segment was identified.  A vertical incision was made through the uterus and with the scalpel.  The uterine incision was extended upwardly and downwardly with bandage scissors.  The placenta was pushed back and the amniotic sac was identified which then bulged through the opening.  Manual amniotomy was performed with clear fluid.  The infant head was then identified.  The bladder blade was removed, and the infant's head was delivered atraumatically.  The infant was delivered completely onto the table and his nose and mouth  were suctioned with the bulb suction.  The cord was clamped x2 and cut.  Cord gas and cord blood samples were obtained and sent.  The infant was given to the waiting pediatricians with a good cry.  The placenta was then removed with traction.  The uterus was exteriorized and cleared of all clots and debris.  The uterine incision was closed in 3 layers first with a running locked layer of the lower portion of the muscularis layer and then an imbricating layer to close completely the muscular layer.  A running layer of the uterine serosa was then used to completely close the uterus.  The bladder flap was repaired with 2-0 Vicryl in a running stitch, and then attention was turned to the patient's  fallopian tubes. Because the uterus was exteriorized and fallopian tubes and fimbria are easily identified and Filshie clips were placed on each, the left and the right fallopian tubes.  Hemostasis was noted.  The uterus was then returned to the abdomen.  The gutters were cleared of all clots and irrigation occurred x2.  The peritoneum was closed with 3-0 Vicryl.  The fascia was reapproximated with 0 Vicryl in a running fashion.  The skin was closed with suture.  The patient tolerated the procedure well. Sponge, lap, and needle counts were correct x2.  1 gram of Ancef was given prior to the procedure.  The patient was taken to the recovery room in stable condition.  At this time, the infant was going to be in the regular nursery.    ______________________________ Lucina Mellow, DO   ______________________________ Scheryl Darter, MD    SH/MEDQ  D:  01/14/2011  T:  01/15/2011  Job:  [id number]  Electronically Signed by Scheryl Darter MD on 01/19/2011 01:54:24 PM Electronically Signed by Lucina Mellow MD on 01/28/2011 10:01:32 AM

## 2011-01-20 LAB — CROSSMATCH
ABO/RH(D): O POS
Antibody Screen: NEGATIVE
Unit division: 0
Unit division: 0
Unit division: 0

## 2011-01-22 ENCOUNTER — Inpatient Hospital Stay (HOSPITAL_COMMUNITY)
Admission: AD | Admit: 2011-01-22 | Discharge: 2011-01-22 | Disposition: A | Discharge status: Home / Self Care | Payer: Self-pay | Source: Ambulatory Visit | Attending: Obstetrics & Gynecology | Admitting: Obstetrics & Gynecology

## 2011-01-22 DIAGNOSIS — Z043 Encounter for examination and observation following other accident: Secondary | ICD-10-CM | POA: Insufficient documentation

## 2011-01-22 DIAGNOSIS — W19XXXA Unspecified fall, initial encounter: Secondary | ICD-10-CM | POA: Insufficient documentation

## 2011-01-22 LAB — CBC
Hemoglobin: 10 g/dL — ABNORMAL LOW (ref 12.0–15.0)
MCH: 28.4 pg (ref 26.0–34.0)
MCV: 87.8 fL (ref 78.0–100.0)
Platelets: 404 10*3/uL — ABNORMAL HIGH (ref 150–400)
RBC: 3.52 MIL/uL — ABNORMAL LOW (ref 3.87–5.11)

## 2011-01-24 NOTE — Discharge Summary (Signed)
NAMEYANCI, Jodi Kelly               ACCOUNT NO.:  [id number]  MEDICAL RECORD NO.:  [id number]  LOCATION:  9126                          FACILITY:  [facility]  PHYSICIAN:  Allie Bossier, MD        DATE OF BIRTH:  Jul 05, 1981  DATE OF ADMISSION:  01/07/2011 DATE OF DISCHARGE:  01/18/2011                              DISCHARGE SUMMARY   PRIMARY OB:  [facility].  ADMITTING DIAGNOSES: 1. Vaginal bleeding. 2. Viable pregnancy at 34 weeks.  DISCHARGE DIAGNOSES: 1. Status post classical cesarean section. 2. Placenta previa. 3. Bilateral tubal ligation. 4. Delivery of viable 35-week female. 5. Gestational diabetes.  DISCHARGE MEDICATIONS: 1. Percocet 5/325, 1 tablet p.o. q.4 h. p.r.n. pain. 2. Motrin 600 mg p.o. q.6 h. p.r.n. pain. 3. Prenatal vitamin. 4. Iron 325 mg p.o. t.i.d. 5. Colace 100 mg p.o. b.i.d. p.r.n. constipation.  PROCEDURES:  Primary classical cesarean section with bilateral tubal ligation with Filshie clips producing a viable 4 pound 19 ounce female at 35.1 weeks with Apgar's of 8 and 9 and estimated blood loss of 1000 mL with placenta previa noted.  LABORATORY DATA:  O+, hepatitis B negative, rubella immune, HIV negative, RPR nonreactive.  Hemoglobin on admission 10.7.  It was 7.7 on June 18 and improved to 7.9 on June 19.  BRIEF HOSPITAL COURSE:  This is a 30 year old female G5, P3-2-0-4, status post classical cesarean section with BTL.  On admission, the patient had significant vaginal bleeding without any contractions and found to have a placenta previa.  The patient initially did not have contractions or signs or symptoms of being in labor.  The patient was expectantly managed and bleeding was monitored.  The patient was placed on strict bedrest and gestational diabetes were controlled with her glyburide. The patient was consented for a C-section and bilateral tubal ligation on the day after admission in the event that she had a severe hemorrhage and needed to  be taken back urgently.  Once the patient was 35.1 weeks, she was taken to the OR for a classical C-section and bilateral tubal ligation.  The patient and baby tolerated the procedure well.  There were no further complications.  The patient was transferred to PACU in stable medical condition and then we transferred to the mother baby floor.  The patient remained stable with normal CBGs.  Mom plans to breast and bottle-feed and had bilateral tubal ligation for birth control.  No staples were used in closing the incision.  DISCHARGE INSTRUCTIONS:  The patient was instructed to continue a regular diet and continue wound care.  ACTIVITY:  Sexual activity per instruction booklet.  FOLLOWUP APPOINTMENTS:  The patient is to follow up with [facility] 6 weeks for postpartum check.  DISCHARGE CONDITION:  The patient and baby were discharged home in stable medical condition with good pain management and instructions for red flags to return for.    ______________________________ Demetria Pore, MD   ______________________________ Allie Bossier, MD    JM/MEDQ  D:  01/18/2011  T:  01/18/2011  Job:  [id number]  Electronically Signed by Demetria Pore MD on 01/20/2011 03:24:53 PM Electronically Signed by Nicholaus Bloom MD  on 01/24/2011 09:16:13 AM

## 2011-01-30 NOTE — Discharge Summary (Signed)
  Jodi Kelly, Jodi Kelly               ACCOUNT NO.:  [id number]  MEDICAL RECORD NO.:  [id number]  LOCATION:  9156                          FACILITY:  [facility]  PHYSICIAN:  Catalina Antigua, MD     DATE OF BIRTH:  October 29, 1980  DATE OF ADMISSION:  12/26/2010 DATE OF DISCHARGE:  01/02/2011                              DISCHARGE SUMMARY   ADMISSION DIAGNOSES:  Bleeding, complete previa.  POSTOPERATIVE DIAGNOSIS:  Bleeding, complete previa.  HOSPITAL COURSE:  This is a 30 year old G5, P3-0-1-3 who presented at 32 plus weeks for first episode of vaginal bleeding secondary to complete previa.  The patient receives her care at the [facility] and her prenatal care has been complicated by gestational diabetes A2/B well controlled on glyburide 2.5 mg b.i.d.  On admission, the patient was noted to have a moderate amount of bright red bleeding, was admitted, placed on bedrest.  The patient received 12 hours of magnesium sulfate for neuro protection and completed the course of betamethasone. Throughout her hospitalization, the vaginal bleeding had resolved and fetal status remained reassuring.  The patient denies any complaints of cramping, contractions, or leakage of fluid.  After a period of 7 days without any bright red bleeding, the patient was deemed to be stable to be discharged home and follow up in our clinic.  Discharge instructions were provided and the patient was advised to return to the MAU if she experienced again bright red vaginal bleeding or decreased fetal movement.  The patient was advised to abstain from intercourse and to restrict her activities to  modified bedrest.  Fetal movement and preterm labor precautions were also reviewed.  The patient verbalized understanding.  The patient was also advised to continue her diabetic diet and current glyburide regimen.  The patient is to schedule an appointment for this week for fetal testing and followup for OB visit next Monday.  A  prescription for glyburide 2.5 mg p.o. b.i.d. was provided to the patient prior to discharge.     Catalina Antigua, MD     PC/MEDQ  D:  01/02/2011  T:  01/02/2011  Job:  [id number]  Electronically Signed by Catalina Antigua  on 01/30/2011 01:12:48 PM

## 2011-03-06 ENCOUNTER — Encounter: Payer: Self-pay | Admitting: Obstetrics and Gynecology

## 2011-03-06 ENCOUNTER — Ambulatory Visit (INDEPENDENT_AMBULATORY_CARE_PROVIDER_SITE_OTHER): Payer: Medicaid Other | Admitting: Family Medicine

## 2011-03-06 DIAGNOSIS — Z8742 Personal history of other diseases of the female genital tract: Secondary | ICD-10-CM

## 2011-03-06 DIAGNOSIS — O24439 Gestational diabetes mellitus in the puerperium, unspecified control: Secondary | ICD-10-CM

## 2011-03-06 MED ORDER — OXYCODONE-ACETAMINOPHEN 5-325 MG PO TABS: 1.0000 | ORAL_TABLET | ORAL | Status: DC | PRN

## 2011-03-06 NOTE — Progress Notes (Signed)
  Subjective:    Patient ID: Jodi Kelly, female    DOB: 1980-09-17, 30 y.o.   MRN: [id number]  HPI Patient is here for a postpartum visit. She is 7 weeks postpartum following a classic c-section for placenta previa with Pfannenstiel skin insicison. I have fully reviewed the prenatal and intrapartum course. The delivery was at 35.2 gestational weeks. Outcome: viable infant female. Anesthesia: spinal.  Postpartum course has been uneventful. Baby's course has been doing well. Baby is feeding by both breast and bottle. Bleeding no bleeding. Bowel function is normal. Bladder function is normal. Patient is sexually active. Contraception method is tubal ligation. Postpartum depression screening: negative (0/30).    Review of Systems  Constitutional: Negative.   HENT: Negative.   Eyes: Negative.   Cardiovascular: Negative.   Gastrointestinal: Positive for nausea, vomiting and abdominal pain. Negative for diarrhea, constipation, blood in stool and abdominal distention.  Genitourinary: Negative.   Musculoskeletal: Negative.   Neurological: Negative.   Hematological: Negative.   Psychiatric/Behavioral: Negative.        Objective:   Physical Exam  Constitutional: She is oriented to person, place, and time. She appears well-developed and well-nourished. No distress.  HENT:  Head: Normocephalic.  Neck: Normal range of motion.  Cardiovascular: Normal rate, regular rhythm and normal heart sounds.  Exam reveals no gallop and no friction rub.   No murmur heard. Pulmonary/Chest: Effort normal and breath sounds normal. No respiratory distress. She has no wheezes.  Abdominal: Soft. Bowel sounds are normal. There is tenderness.       Tenderness along incisional scar. No scar tissue formation palpated.  Genitourinary: Vagina normal. Uterus is tender. Uterus is not deviated, not enlarged and not fixed. Cervix exhibits no motion tenderness. Right adnexum displays no tenderness. Left adnexum displays  tenderness.  Musculoskeletal: Normal range of motion.  Neurological: She is alert and oriented to person, place, and time. She has normal reflexes.  Skin: Skin is warm and dry. No rash noted. She is not diaphoretic.  Psychiatric: She has a normal mood and affect.          Assessment & Plan:  1. Postpartum exam: pt is doing well. No complaints with breastfeeding or intercourse. Had BTL at the time of her c-section. Scar healing well.  EPDS 0/30. Will need repeat pap in Jan 2013. 2. S/P C-section: Refilled percocet. If pain continues after an additional 2-4 weeks or worsens, she should make an appointment to be seen again for evaluation and treatment as warranted. 3. Hx of GDM: Will have patient return for 2 hour glucola.

## 2011-03-08 ENCOUNTER — Other Ambulatory Visit: Payer: Medicaid Other

## 2011-03-08 DIAGNOSIS — O24439 Gestational diabetes mellitus in the puerperium, unspecified control: Secondary | ICD-10-CM

## 2011-03-09 ENCOUNTER — Telehealth: Payer: Self-pay

## 2011-03-09 NOTE — Telephone Encounter (Signed)
Called pt via Spanish interpreter and left message to return our call to the clinics.

## 2011-03-09 NOTE — Telephone Encounter (Signed)
Pt returned our call to the clinics.  Speaking with pt via Spanish interpreter Gardiner Rhyme pt was informed that her 2 hour glucose was normal and to inform her PCP of the information stated by Dr. Natale Milch.

## 2011-03-09 NOTE — Telephone Encounter (Signed)
Message copied by Faythe Casa on Thu Mar 09, 2011 10:37 AM ------      Message from: Delena Bali      Created: Thu Mar 09, 2011  9:03 AM       Please notify patient that her glucose test was normal. She will need to let her primary care doctor know that she was gestational diabetic so that appropriate testing can be completed during her routine primary care visits.

## 2011-04-20 LAB — CBC
HCT: 36.8
MCV: 86.3
Platelets: 264
RDW: 13.2

## 2011-04-20 LAB — POCT I-STAT CREATININE
Creatinine, Ser: 0.7
Operator id: 282201

## 2011-04-20 LAB — DIFFERENTIAL
Lymphocytes Relative: 26
Lymphs Abs: 2.6
Monocytes Relative: 8
Neutrophils Relative %: 64

## 2011-04-20 LAB — POCT CARDIAC MARKERS
CKMB, poc: <1 — ABNORMAL LOW
Myoglobin, poc: 43.4
Troponin i, poc: <0.05

## 2011-04-20 LAB — I-STAT 8, (EC8 V) (CONVERTED LAB)
Acid-base deficit: 2
Bicarbonate: 23.1
TCO2: 24
pCO2, Ven: 41.7 — ABNORMAL LOW
pH, Ven: 7.351 — ABNORMAL HIGH

## 2011-04-25 LAB — ANAEROBIC CULTURE

## 2011-04-25 LAB — TISSUE CULTURE

## 2011-04-25 LAB — CBC
HCT: 38.4
Hemoglobin: 13.3
MCHC: 34.6
MCV: 85.5
RBC: 4.49
WBC: 7.2

## 2011-04-25 LAB — WOUND CULTURE: Culture: NO GROWTH

## 2011-05-18 LAB — URINALYSIS, ROUTINE W REFLEX MICROSCOPIC
Bilirubin Urine: NEGATIVE
Glucose, UA: NEGATIVE
Ketones, ur: 15 — AB
pH: 6

## 2011-05-18 LAB — DIFFERENTIAL
Basophils Absolute: 0
Basophils Relative: 0
Eosinophils Relative: 0
Monocytes Absolute: 0.4
Neutro Abs: 11.7 — ABNORMAL HIGH

## 2011-05-18 LAB — COMPREHENSIVE METABOLIC PANEL
Albumin: 4
Alkaline Phosphatase: 69
BUN: 15
CO2: 22
Chloride: 108
GFR calc non Af Amer: >60
Potassium: 3.4 — ABNORMAL LOW
Total Bilirubin: 0.6

## 2011-05-18 LAB — CBC
HCT: 36.2
Hemoglobin: 12.7
Platelets: 267
RBC: 4.25
WBC: 13.4 — ABNORMAL HIGH

## 2011-06-08 ENCOUNTER — Ambulatory Visit: Payer: Medicaid Other | Admitting: Family Medicine

## 2011-08-24 ENCOUNTER — Encounter: Payer: Self-pay | Admitting: Family Medicine

## 2011-08-24 ENCOUNTER — Ambulatory Visit (INDEPENDENT_AMBULATORY_CARE_PROVIDER_SITE_OTHER): Payer: Self-pay | Admitting: Family Medicine

## 2011-08-24 DIAGNOSIS — M67439 Ganglion, unspecified wrist: Secondary | ICD-10-CM

## 2011-08-24 DIAGNOSIS — M6740 Ganglion, unspecified site: Secondary | ICD-10-CM

## 2011-08-28 DIAGNOSIS — M67439 Ganglion, unspecified wrist: Secondary | ICD-10-CM | POA: Insufficient documentation

## 2011-08-28 NOTE — Progress Notes (Signed)
  Subjective:    Patient ID: Jodi Kelly, female    DOB: [birth year], 31 y.o.   MRN: [id number]  HPI Knot on right wrist: Located on dorsal aspect.  Has noticed for 1 week.  Seems to have gotten a bit larger.  Minimal tenderness.  No drainage.  No opening.  No other joint swelling.   Not very painful, but mild tenderness with flexion and extension of right wrist.  No history of ganglion cyst.  No fever.     Review of Systems As per above.     Objective:   Physical Exam  Constitutional: She appears well-developed and well-nourished. No distress.  Cardiovascular: Normal rate.   Pulmonary/Chest: Effort normal. No respiratory distress.  Musculoskeletal:       Small cyst on right posterior hand area near wrist joint. No redness, no edema.  No tenderness.    Skin: No rash noted.          Assessment & Plan:

## 2011-08-28 NOTE — Assessment & Plan Note (Signed)
Exam consistent with ganglion cyst- a little unusual in that it seems to have increased in size very quickly.  Pt does have discomfort with it due to location.  Reassured pt that this is a benign process and that intervention is done only if it does resolve and continues to cause discomfort.  Reviewed treatment options of injection as well as referral to surgery to consider surgical options if significant pain.  Pt agrees to watchful waiting for now.  Return as needed. Pt also due for annual exam.  Pt to schedule.

## 2011-09-07 ENCOUNTER — Other Ambulatory Visit: Payer: Self-pay | Admitting: Family Medicine

## 2011-12-14 ENCOUNTER — Encounter: Payer: Self-pay | Admitting: Family Medicine

## 2011-12-14 ENCOUNTER — Ambulatory Visit (INDEPENDENT_AMBULATORY_CARE_PROVIDER_SITE_OTHER): Payer: Self-pay | Admitting: Family Medicine

## 2011-12-14 VITALS — BP 124/86 | HR 82 | Temp 98.4°F | Ht 58.5 in | Wt 158.0 lb

## 2011-12-14 DIAGNOSIS — Z8742 Personal history of other diseases of the female genital tract: Secondary | ICD-10-CM

## 2011-12-14 DIAGNOSIS — R5383 Other fatigue: Secondary | ICD-10-CM | POA: Insufficient documentation

## 2011-12-14 DIAGNOSIS — R7309 Other abnormal glucose: Secondary | ICD-10-CM

## 2011-12-14 DIAGNOSIS — R5381 Other malaise: Secondary | ICD-10-CM

## 2011-12-14 DIAGNOSIS — M719 Bursopathy, unspecified: Secondary | ICD-10-CM

## 2011-12-14 DIAGNOSIS — M67912 Unspecified disorder of synovium and tendon, left shoulder: Secondary | ICD-10-CM

## 2011-12-14 NOTE — Assessment & Plan Note (Signed)
Symptoms mild currently- but bothersome.  Most likely inflammation of the cuff s/p fall and strain of left shoulder.  Home exercises given in spanish.  Pt to return in 1 month- or sooner if new or worsening of symptoms.  If no improvement over the next month will consider sending patient to formal PT.

## 2011-12-14 NOTE — Assessment & Plan Note (Addendum)
Pt concerned that this was being caused by hyperglycemia.  a1c wnl.  TSH, CBC (to assess for anemia- had anemia postpartum- has regular periods), BMET (to assess electrolytes.) will call or mail results to patient.

## 2011-12-14 NOTE — Patient Instructions (Signed)
EJERCICIOS DE AMPLITUD DE MOVIMIENTOS Y ELONGACIN - Sndrome de choque (manguito rotador tendinitis, bursitis) Estos ejercicios le ayudarn en la recuperacin de la lesin. Los sntomas podrn desaparecer con o sin mayor intervencin del profesional, el fisioterapeuta o Orthoptist. Al completar estos ejercicios, recuerde:   Restaurar la flexibilidad del tejido ayuda a que las articulaciones recuperen el movimiento normal. Esto permite que el movimiento y la actividad sea ms saludables y menos dolorosos.   Para que sea efectiva, cada elongacin debe realizarse durante al menos 30 segundos.   La elongacin nunca debe ser dolorosa. Deber sentir slo un alargamiento suave o elongacin del tejido que estira.  FUERZA - Flexin, de pie  Pngase de pie con una buena postura. Con un agarre en supinacin en su mano derecha / izquierdo, y un agarre en pronacin de la mano opuesta, agarre un palo de escoba o caa de modo que sus manos queden un poco ms separadas que el ancho de los hombros.   Con los msculos del codo Sales executive / izquierdo rectos y los hombros relajados, empuje el bastn con la mano opuesta, para elevar su brazo derecha / izquierdo delante de su cuerpo y por encima de la cabeza. Levante el brazo hasta que sientas un estiramiento en el hombro derecha / izquierdo, pero sin sentir un aumento del dolor.   Trate de Community education officer / izquierdo a Advertising account executive, y [place] el omplato inclinado hacia abajo y hacia la espina dorsal media de la espalda. Mantenga esta posicin durante __________ segundos.   Vuelva lentamente a la posicin inicial.  Reptalo __________ veces. Realice este estiramiento __________ Anthoney Harada por da. FUERZA - Abduccin, posicin supina  Acustese sobre la espada. Tome un palo de escoba o caa con una palma derecha / izquierdo hacia abajo y la otra palma hacia arriba de modo que sus manos tengan una separacin de un poco ms que el ancho  de sus hombros..   Con los msculos del codo Sales executive / izquierdo rectos y los hombros relajados, empuje el bastn con la mano opuesta, para elevar su brazo derecha / izquierdo delante de su cuerpo y por encima de la cabeza. Levante el brazo hasta que sientas un estiramiento en el hombro derecha / izquierdo, pero sin sentir un aumento del dolor.   Trate de Community education officer / izquierdo a Advertising account executive, y [place] el omplato inclinado hacia abajo y hacia la espina dorsal media de la espalda. Mantenga esta posicin durante __________ segundos.   Vuelva lentamente a la posicin inicial.  Reptalo __________ veces. Realice este estiramiento __________ Anthoney Harada por da.  ROM - Flexin, asistida activa  Acustese sobre la espada. Podr doblar las rodillas para estar ms cmodo   Tome un palo de escoba o un bastn de modo que sus manos queden a la misma distancia que sus hombros. La mano derecha / izquierdo debe sujetar el extremo del palo, de modo que la mano est "pulgares arriba", como si estuviera a punto de dar la [place].   Con su brazo sano para Science writer, Administrator / izquierdo sobre la cabeza, Teacher, adult education que sienta un suave estiramiento en el hombro. Mantenga esta posicin durante __________ segundos.   Utilice el bastn para ayudarse a Designer, jewellery / izquierdo a la posicin inicial.  Reptalo __________ veces. Realice este estiramiento __________ Anthoney Harada por da.  ROM - Rotacin interna, en posicin supina  Recustese sobre su espalda en Neomia Dear  superficie firme. Coloque el codo derecha / izquierdo a 60 grados de distancia de su lado. Eleve el codo con una toalla doblada, de manera que el codo y el hombro estn a la misma altura.   Utilice un palo de escoba o caa y su brazo sano, tire de su mano derecha / izquierdo hacia su cuerpo hasta que sienta un suave estiramiento, pero ningn aumento en su dolor en el hombro. Mantenga su hombro y el codo en su  lugar durante todo el ejercicio.   Mantenga esta posicin durante __________ segundos. Vuelva lentamente a la posicin inicial.  Reptalo __________ veces. Realice este estiramiento __________ Anthoney Harada por da. ELONGACIN - Rotacin interna  Coloque la mano derecha / izquierdo detrs de la espalda, con la palma [place] arriba.   Coloque una toalla o un cinturn sobre el hombro opuesto. Tome la toalla con la mano derecha / izquierdo.   Con una postura erguida, tire suavemente hacia arriba la [place], hasta que sienta un estiramiento en la parte frontal del hombro derecha / izquierdo.   Trate de Community education officer / izquierdo a Advertising account executive, y [place] el omplato inclinado hacia abajo y hacia la espina dorsal media de la espalda.   Mantenga esta posicin durante __________ segundos. Afloje la elongacin bajando la mano sana.  Reptalo __________ veces. Realice este estiramiento __________ Anthoney Harada por da.  ROM - Rotacin interna  Tome un palo con ambas manos por detrs de la espalda, con las palmas [place].   De pie y erguido en buena postura, deslice el palo hacia arriba por la espalda hasta sentir un suave estiramiento en la zona anterior de los hombros.   Mantenga esta posicin durante __________ segundos. Vuelva lentamente a la posicin inicial.  Reptalo __________ veces. Realice este estiramiento __________ Anthoney Harada por da.  ELONGACIN - Cpsula posterior del hombro  Prese o sintese en una postura correcta. Cruce el hombro derecha / izquierdo Dealer, y Therapist, nutritional a la misma altura que el hombro.   Tire del codo de [employer] el brazo quede cerca de su pecho. Tire hasta que sienta un estiramiento en la parte trasera del hombro.   Mantenga esta posicin durante __________ segundos.  Reptalo __________ veces. Realice este estiramiento __________ Anthoney Harada por da. EJERCICIOS DE ESTIRAMIENTO - Sndrome de choque (manguito rotador, tendinitis,  bursitis) Estos ejercicios le ayudarn en la recuperacin de la lesin. Los sntomas podrn desaparecer con o sin mayor intervencin del profesional, el fisioterapeuta o Orthoptist. Al completar estos ejercicios, recuerde:   Los msculos pueden ganar tanto la resistencia como la fuerza que necesita para sus actividades diarias a travs de ejercicios controlados.   Realice los ejercicios como se lo indic el mdico, el fisioterapeuta o Orthoptist. Aumente la resistencia y las repeticiones segn se le haya indicado.   Podr experimentar dolor o cansancio muscular, pero el dolor o molestia que trata de eliminar a travs de los ejercicios nunca debe empeorar. Si el dolor empeora, detngase y asegrese de que est siguiendo las directivas correctamente. Si an siente dolor luego de Education officer, environmental lo ajustes necesarios, deber discontinuar el ejercicio hasta que pueda conversar con el profesional sobre el problema.   Durante la recuperacin, evite las actividades o ejercicios que impliquen acciones que le obliguen a Scientific laboratory technician la mano lesionada o el codo encima de la cabeza o detrs de la espalda o la cabeza. Estas posturas tensionan los tejidos que estn tratando de Barrister's clerk.  FUERZA - Depresin  y aduccin escapular   Sintese con Neomia Dear buena postura en una silla firme. Apoye los brazos delante de usted, con Sells, reposabrazos, o sobre una mesa. Mantenga los codos en lnea con los lados de su cuerpo.   Lleve suavemente los omplatos hacia abajo y hacia la zona media posterior de la columna. Aumente gradualmente la tensin, sin tensar los msculos de la parte superior de los hombros y la parte posterior de su cuello.   Mantenga esta posicin durante __________ segundos. Libere lentamente la tensin y relaje los msculos antes de iniciar la siguiente repeticin.   Despus de haber practicado este ejercicio, quite el soporte del brazo y complete el ejercicio de pie, de la misma manera que la posicin de  sentado.  Reptalo __________ veces. Realice este estiramiento __________ Anthoney Harada por da.  FUERZA - Abductores del hombro, isomtrica  Con una buena postura, de pie o sentado cerca de 4-6 pulgadas de la pared, con su lado derecha / izquierdo frente al muro.   Doble el codo derecha / izquierdo. Empuje suavemente el codo derecha / izquierdo contra la pared. Aumente gradualmente la presin [employer] pueda sin llegar a encojer el hombro ni Tax inspector.   Mantenga esta posicin durante __________ segundos.   Libere la tensin lentamente. Relaje los msculos de los hombros antes de empezar la siguiente repeticin.  Reptalo __________ veces. Realice este estiramiento __________ Anthoney Harada por da.  FUERZA - Rotacin externa, isomtrica  Mantenga el codo derecha / izquierdo a su lado y dblelo a 90 grados.   Prese en un marco de puerta para que el exterior de su Tax inspector / izquierdo pueda presionar contra ste sin separar su brazo de su lado.   Con suavidad presione la Tax inspector / izquierdo Lexicographer de la puerta, como si estuviera tratando de llevar el dorso de su mano lejos de su estmago. Aumente la presin de forma gradual, tan fuerte como usted pueda, sin encogerse de hombros ni sentir un aumento de las [employer].   Mantenga esta posicin durante __________ segundos.   Libere la tensin lentamente. Relaje los msculos de los hombros antes de empezar la siguiente repeticin.  Reptalo __________ veces. Realice este estiramiento __________ Anthoney Harada por da.  FUERZA - Supraespinoso  Pngase de pie o sintese con una buena postura. Sostenga un peso de __________ Cheron Schaumann banda de goma para ejercicios, de modo que su mano quede con el pulgar hacia arriba, como cuando [employer].   Levante lentamente el brazo derecha / izquierdo separndolo del muslo en forma de "V", en diagonal por el espacio entre el costado y el frente. Levante la mano [employer] altura del  hombro o tanto como pueda, sin Tax inspector. Al principio, muchas personas no pueden levantar las manos por arriba de la altura del hombro.   Trate de Community education officer / izquierdo a Advertising account executive, y [employer] el omplato inclinado hacia abajo y hacia la espina dorsal media de la espalda.   Mantenga esta posicin durante __________ segundos. Controle el descenso de la mano de modo que vuelva a la posicin inicial lo ms lentamente posible.  Reptalo __________ veces. Realice este estiramiento __________ Anthoney Harada por da.  FUERZA - Rotadores externos  Asegure una banda o tubo de goma a un objeto fijo (mesa, columna) de modo que quede a la misma altura que su codo Sales executive / izquierdo Engineer, manufacturing systems se encuentre de pie o sentado sobre [employer]  superficie firme.   Prese o sintese de [employer]	[place] banda de goma se encuentre de su lado lesionado.   Doble el codo derecha / izquierdo a 90 grados. Coloque una toalla doblada o una pequea almohada debajo de su brazo derecha / izquierdo de modo que su codo quede algunas pulgadas separado de su cuerpo.   Manteniendo la tensin en la banda de goma, tire de la misma hacia afuera de su cuerpo, como pivoteando en el codo. Asegrese de [employer] su cuerpo derecho, de modo que el movimiento slo provenga de la rotacin del hombro.   Mantenga esta posicin durante __________ segundos. Libere la tensin de modo controlado mientras vuelve a la posicin inicial.  Reptalo __________ veces. Realice este estiramiento __________ Anthoney Harada por da.  FUERZA - Rotadores internos  Asegure una banda o tubo de goma a un objeto fijo (mesa, columna) de modo que quede a la misma altura que su codo Sales executive / izquierdo Engineer, manufacturing systems se encuentre de pie o sentado sobre una superficie firme.   Prese o sintese de [employer]	[place] banda de goma se encuentre de su lado derecha / izquierdo.   Doble el codo a 90 grados. Coloque una toalla doblada o una pequea almohada debajo  de su brazo derecha / izquierdo de modo que su codo quede algunas pulgadas separado de su cuerpo.   Manteniendo la tensin en la banda, tire de la misma cruzando sobre su cuerpo, [place]. Asegrese de [employer] su cuerpo derecho, de modo que el movimiento slo provenga de la rotacin del hombro.   Mantenga esta posicin durante __________ segundos. Libere la tensin de modo controlado mientras vuelve a la posicin inicial.  Reptalo __________ veces. Realice este estiramiento __________ Anthoney Harada por da.  FUERZA - Protractor escapular - de pie  Prese la distancia de sus brazos separado de la pared. Coloque las [employer] pared, [employer] codos derechos.   Comience con los omplatos hacia abajo y hacia la zona media posterior de la columna.   Para fortalecer los extensores, [address] los omplatos [place], [place] deslcelos hacia adelante sobre el trax. Sentir como si estuviera separada la zona posterior del trax de la pared. Es movimiento sutil y puede ser difcil de Education officer, environmental. Consulte con su mdico para que d ms instrucciones si no est seguro de Aeronautical engineer.   Mantenga esta posicin durante __________ segundos. Vuelva lentamente a la posicin inicial, y haga descansar completamente los msculos antes de repetir.  Reptalo __________ veces. Realice este estiramiento __________ Anthoney Harada por da. FUERZA IT consultant - en posicin [place]  Recustese sobre su espalda en una superficie firme. Extienda su brazo derecha / izquierdo recto en el aire mientras sostiene un peso de __________ en la mano.   Mantenga la cabeza y la espalda en su lugar, levante los hombros del piso.   Mantenga esta posicin durante __________ segundos. Vuelva lentamente a la posicin inicial y relaje los msculos antes de iniciar la siguiente repeticin.  Reptalo __________ veces. Realice este estiramiento __________ Anthoney Harada por da. FUERZA - Protractor escapular -  Office manager en las rodillas y las manos, con los hombros directamente sobre las manos (o tan prximos como pueda).   Manteniendo los hombros trabados, levante la zona posterior del trax hacia los omplatos, de modo que la zona media de la espalda se redondee. Mantenga los msculos del cuello relajados.   Mantenga esta posicin durante __________ segundos. Vuelva lentamente a la posicin inicial y relaje los  msculos antes de iniciar la siguiente repeticin.  Reptalo __________ veces. Realice este estiramiento __________ Anthoney Harada por da.  FUERZA - Retractor escapular  Asegure una banda o tubo de goma a un objeto fijo (mesa, columna) de modo que quede a la misma altura que sus hombros mientras se encuentre de pie o sentado en una silla firme sin apoyabrazos.   Con las palmas [place], tome un extremo de la banda con cada mano. Enderece los codos y levante las manos directamente frente usted, a la altura de los hombros. [place] hacia atrs, alejndose del extremo fijo de la banda, [place] que se tense.   Tratando de juntar presionando los omplatos, lleve los codos hacia atrs [employer] dobla. Mantenga los brazos separados del cuerpo durante todo el ejercicio.   Mantenga esta posicin durante __________ segundos. Lentamente libere la tensin de la banda, volviendo a la posicin inicial.  Reptalo __________ veces. Realice este estiramiento __________ Anthoney Harada por da. FUERZA - Extensores del hombro  Asegure una banda o tubo de goma a un objeto fijo (mesa, columna) de modo que quede a la misma altura que sus hombros mientras se encuentre de pie o sentado en una silla firme sin apoyabrazos.   Con los pulgares [place], tome un extremo de la banda con cada mano. Enderece los codos y levante las manos directamente frente usted, a la altura de los hombros. Camine hacia atrs, alejndose del extremo fijo de la banda, [place] que se tense.   Presionando los omplatos para juntarlos, lleve las  manos a los lados de los muslos. No deje que las manos vayan ms all.   Mantenga esta posicin durante __________ segundos. Lentamente libere la tensin de la banda, volviendo a la posicin inicial.  Reptalo __________ veces. Realice este estiramiento __________ Anthoney Harada por da.  FUERZA - Retractores escapulares y rotadores externos  Asegure una banda o tubo de goma a un objeto fijo (mesa, columna) de modo que quede a la misma altura que sus hombros mientras se encuentre de pie o sentado en una silla firme sin apoyabrazos.   Con las palmas [place], tome un extremo de la banda con cada mano. Doble los hombros a 90 grados y [employer] codos [employer] altura de los hombros, a los lados. Camine hacia atrs, alejndose del extremo fijo de la banda, [place] que se tense.   Presionando los omplatos para juntarlos, rote los hombros de modo que la zona superior de los brazos y codos Special educational needs teacher quietos, [employer] puos se eleven hasta la altura de la cabeza.   Mantenga esta posicin durante __________ segundos. Lentamente libere la tensin de la banda, volviendo a la posicin inicial.  Reptalo __________ veces. Realice este estiramiento __________ Anthoney Harada por da.  FUERZA - Retractores escapulares y rotadores externos - remo  Asegure una banda o tubo de goma a un objeto fijo (mesa, columna) de modo que quede a la misma altura que sus hombros mientras se encuentre de pie o sentado en una silla firme sin apoyabrazos.   Con las palmas [place], tome un extremo de la banda con cada mano. Enderece los codos y levante las manos directamente frente usted, a la altura de los hombros. Camine hacia atrs, alejndose del extremo fijo de la banda, [place] que se tense.   Paso 1: [employer] ambos omplatos. Doble los codos, lleve las manos al pecho, como si [place]. Al final de [employer], las manos y codos deben quedar a la altura del hombro, y los codos  deben estar separados de los lados.   Paso 2: Rote los  hombros, para Optometrist las manos por arriba de la cabeza. Los antebrazos deben quedar verticales y la zona superior de los brazos debe quedar horizontal.   Mantenga esta posicin durante __________ segundos. Lentamente libere la tensin de la banda, volviendo a la posicin inicial.  Reptalo __________ veces. Realice este estiramiento __________ Anthoney Harada por da.  FUERZA - Depresores escapulares  Busque una silla maciza sin ruedas, como una silla de comedor.   Manteniendo los pies sobre el piso y las manos en los apoyabrazos, levante las nalgas del asiento y trabe los codos.   Manteniendo los codos rectos, deje que la gravedad empuje su cuerpo [place] abajo. Los hombros se elevarn [employer].   Eleve el cuerpo oponindose a la gravedad, llevando los omplatos hacia la espalda y acortando la distancia entre los hombros y [place]. Aunque los pies mantengan contacto con el piso, deben soportar cada vez menos peso, a medida que se fortalece.   Mantenga esta posicin durante __________ segundos. De modo lento y controlado, baje el cuerpo para repetir.  Reptalo __________ veces. Realice este estiramiento __________ Anthoney Harada por da.  Document Released: 05/03/2006 Document Revised: 07/06/2011 [facility] Patient Information 2012 [place], [place] .Lesin del Engineer, drilling Soil scientist Injury) El manguito rotador es un conjunto de msculos y tendones (los msculos supraespinoso, infraespinoso, subscapular, redondo Adult nurse y sus tendones correspondientes) que componen la unidad estabilizadora del hombro. Este conjunto de msculos y tendones sostienen la cabeza (esfera) del hmero (hueso de la parte superior del brazo) en la fosa (cavidad) de la escpula (omplato). Los traumatismos en esta unidad estabilizadora generalmente se producen por la prctica de deportes o por actividades que hacen que el brazo se mueva repetidamente sobre la cabeza del hmero. Algunos ejemplos son: lanzamiento, levantar peso,  nadar, deportes con raqueta o traumatismos, como caerse [employer]. La irritacin crnica de esta unidad puede causar inflamacin, bursitis y finalmente daos en los tendones hasta el punto de la ruptura. Un traumatismo agudo (sbito) del [employer] rotador puede dar como resultado una ruptura parcial o completa. Si la ruptura es [place], Paramedic. Neomia Dear ruptura pequea podr tratarse de [place] conservadora con inmovilizacin temporaria y reposo. Podr necesitar fisioterapia. INSTRUCCIONES PARA EL CUIDADO DOMICILIARIO  Aplique hielo sobre la lesin durante 15 a 20 minutos 3 a 4 veces por [employer] primeros 2 [address]  [address]. Ponga el hielo en una bolsa plstica y coloque una toalla entre la bolsa y la piel.   Si le han inmovilizado el brazo (con un cabestrillo y tirantes), no los retire Sales executive tiempo que se lo indique su mdico lo vea un profesional en la visita de seguimiento. Si necesita quitarlos, mueva el brazo lo menos posible.   Podr dormir sobre varias almohadas para disminuir la hinchazn y Chief Technology Officer.   Utilice los medicamentos de venta libre o de prescripcin para Chief Technology Officer, Environmental health practitioner o la [place], segn se lo indique el profesional que lo asiste.   Realice ejercicios simples apretando una pelota blanda de goma para disminuir la hinchazn de la [place].  SOLICITE ATENCIN MDICA SI:  El dolor en el hombro [place], o siente un nuevo dolor en el brazo, en la mano o en los dedos.   La mano o los dedos estn ms fros que la [place].  SOLICITE ATENCIN MDICA DE INMEDIATO SI:  El brazo, la mano o los dedos estn adormecidos o siente hormigueos.   El  brazo, la mano o los dedos estn hinchados, le duelen o se ven blancos o azules.  Document Released: 04/26/2005 Document Revised: 07/06/2011 [facility] Patient Information 2012 [place], [place].

## 2011-12-14 NOTE — Progress Notes (Signed)
  Subjective:    Patient ID: Jodi Kelly, female    DOB: [birth year], 31 y.o.   MRN: [id number]  HPI Concern about hyperglycemia and fatigue: Patient states that she has a past history of gestational diabetes. Also has a strong family history of diabetes in her family. Patient is concerned that she has diabetes. Has been checking blood sugar and fasting blood sugars 115. Often blood sugars are less than 100 but was concerned because she had had a few fasting blood sugars of 115. Patient also endorses fatigue for the past few months. Has been eating well. Drinking well. No nausea. No Vomiting. No diarrhea. Occasional swelling of hands and feet. No hair loss. No heat or cold intolerance. Positive increased thirst. No polyphasia. No polyuria.  Left shoulder pain: Patient fell approximately one month ago. Was holding daughter in left arm. Has had left shoulder pain since that time. Pain with reaching behind her back in left shoulder. Some pain when raising arms above her head. No joint redness. No joint swelling.   Review of Systems    as per above. Objective:   Physical Exam  Constitutional: She appears well-developed and well-nourished.  HENT:  Head: Normocephalic and atraumatic.  Eyes: Pupils are equal, round, and reactive to light. Right eye exhibits no discharge. Left eye exhibits no discharge.  Neck: No tracheal deviation present. No thyromegaly (no nodules palpated) present.  Cardiovascular: Normal rate, regular rhythm and normal heart sounds.   No murmur heard. Pulmonary/Chest: Effort normal and breath sounds normal. No respiratory distress. She has no wheezes. She has no rales.  Abdominal: Soft. She exhibits no distension.  Musculoskeletal: She exhibits no edema.       Left  shoulder exam: Nearly full rom- but pain with internal rotation of left arm.  Also when raising left arm directly over head this caused discomfort- but was still able to complete the task.  Some tenderness to  palpation of left shoulder in area of acromion on.  No redness. No swelling.  + empty can test on left shoulder.   Lymphadenopathy:    She has no cervical adenopathy.  Neurological: She is alert.          Assessment & Plan:

## 2011-12-14 NOTE — Assessment & Plan Note (Signed)
A1C 5.8- no diagnosis of diabetes at this time.  Reviewed with patient.  Will need yearly A1c screenings in the setting of pmh of gest diabetes and strong family history of diabetes.

## 2011-12-15 LAB — CBC
HCT: 37.3 % (ref 36.0–46.0)
Hemoglobin: 12.2 g/dL (ref 12.0–15.0)
MCH: 28.6 pg (ref 26.0–34.0)
MCHC: 32.7 g/dL (ref 30.0–36.0)

## 2011-12-15 LAB — TSH: TSH: 1.047 u[IU]/mL (ref 0.350–4.500)

## 2011-12-15 LAB — BASIC METABOLIC PANEL
CO2: 26 mEq/L (ref 19–32)
Chloride: 105 mEq/L (ref 96–112)
Potassium: 3.9 mEq/L (ref 3.5–5.3)
Sodium: 139 mEq/L (ref 135–145)

## 2011-12-20 ENCOUNTER — Encounter: Payer: Self-pay | Admitting: Family Medicine

## 2012-10-23 ENCOUNTER — Encounter: Payer: Self-pay | Admitting: Family Medicine

## 2012-10-23 ENCOUNTER — Ambulatory Visit (INDEPENDENT_AMBULATORY_CARE_PROVIDER_SITE_OTHER): Payer: Self-pay | Admitting: Family Medicine

## 2012-10-23 VITALS — BP 110/68 | HR 66 | Temp 98.1°F | Ht 58.5 in | Wt 155.0 lb

## 2012-10-23 DIAGNOSIS — N644 Mastodynia: Secondary | ICD-10-CM | POA: Insufficient documentation

## 2012-10-23 NOTE — Progress Notes (Signed)
  Subjective:    Patient ID: Jodi Kelly, female    DOB: 07/24/1981, 32 y.o.   MRN: [id number]  HPI # Right breast pain For the past 2 weeks She breastfeeds her 43-year-old and the pain bothers her then. 10/10 pain at that time, 7/10 pain other times. She breastfeeds three times daily.  Denies nausea, vomiting, fevers/chills; denies pus  Medications tried: none No changes in how her baby has been feeding in the past couple of weeks.    She had a breat abscess in that breast and underwent I&D about 3 years ago. She was breastfeeding at that time.  She has 4 children. 0  Review of Systems Per HPI  Allergies, medication, past medical history reviewed.  Smoking status noted.     Objective:   Physical Exam GEN: NAD BREAST: mild tenderness superior portion of right upper breasts; mild tenderness also in left breasts; some cracking of right nipple; no erythema, warmth, rash; no masses palpable; breasts do not appear engorged but right breast may be larger than left     Assessment & Plan:

## 2012-10-23 NOTE — Assessment & Plan Note (Addendum)
She breastfeeds her two year old.  No signs of abscess/mastitis/cellulitis at this time.  We will treat as plugged milk duct with Tylenol, warm compresses, massages.  Follow-up in 2 weeks. She does have some cracked skin around the nipple; advised to use Vaseline after she breast feeds. May consider ultrasound at that time if pain is still significant.  She was advised to follow-up sooner if worsening symptoms, signs of infection.

## 2012-10-23 NOTE — Patient Instructions (Signed)
Haga una cita conmigo en 2 semanas  Para el dolor de pecho: -Tylenol 1000 mg dos veces diario -Lienzos de agua caliente -Masaje   Para el piel: use vaseline despues de dar de pecho

## 2012-11-06 ENCOUNTER — Ambulatory Visit: Payer: Self-pay | Admitting: Family Medicine

## 2013-11-18 ENCOUNTER — Ambulatory Visit (INDEPENDENT_AMBULATORY_CARE_PROVIDER_SITE_OTHER): Payer: Self-pay | Admitting: Family Medicine

## 2013-11-18 ENCOUNTER — Encounter: Payer: Self-pay | Admitting: Family Medicine

## 2013-11-18 VITALS — BP 111/75 | HR 70 | Temp 98.2°F | Wt 162.0 lb

## 2013-11-18 DIAGNOSIS — T192XXA Foreign body in vulva and vagina, initial encounter: Secondary | ICD-10-CM | POA: Insufficient documentation

## 2013-11-18 DIAGNOSIS — R300 Dysuria: Secondary | ICD-10-CM

## 2013-11-18 DIAGNOSIS — R102 Pelvic and perineal pain: Secondary | ICD-10-CM

## 2013-11-18 LAB — POCT URINALYSIS DIPSTICK
BILIRUBIN UA: NEGATIVE
GLUCOSE UA: NEGATIVE
Ketones, UA: NEGATIVE
LEUKOCYTES UA: NEGATIVE
NITRITE UA: NEGATIVE
Protein, UA: NEGATIVE
RBC UA: NEGATIVE
Spec Grav, UA: 1.02
UROBILINOGEN UA: 0.2
pH, UA: 8

## 2013-11-18 NOTE — Assessment & Plan Note (Signed)
No foreign body seen with speculum assessment. It is unlikely Tampon will go into her uterus. Pelvic exam otherwise normal. Other option is to obtain pelvic U/S but it will cost her $400-700,as discussed with her it is unlikely tampon will go up her cervix. Patient advised to follow up if still symptomatic, then I will obtain pelvic U/S. She verbalized understanding and agreed with plan. Interpreter used to communicate with her.

## 2013-11-18 NOTE — Assessment & Plan Note (Signed)
Likely due to prior irritation from tampon use vs anxiety. Urinalysis done today was completely normal. Good vagina hygiene recommended.

## 2013-11-18 NOTE — Patient Instructions (Signed)
Cuerpo extraño en la vagina   (Vaginal Foreign Body)   Todo objeto hallado en la vagina se denomina "cuerpo extraño". Ningún cuerpo extraño debe permanecer en la vagina durante más de 6 horas. Puede causar sangrado, picazón, dolor, inflamación (hinchazón) o sarpullido en la zona. Podrá contraer una infección, incluso después de retirado el objeto.  No trate de quitar el objeto usted misma, excepto que le resulte fácil de agarrar.   CUIDADOS EN EL HOGAR   · No deje los tampones colocados durante más de 6 a 8 horas. No use tampones mientras está en la cama. Para dormir use un apósito.  · No introduzca objetos en la vagina durante las actividades sexuales. Haga un seguimiento con su médico dentro de 1 semana.  · No higienice la vagina con un chorro de agua (ducha vaginal).  · Concurra a una consulta con el médico dentro de 1 semana.  SOLICITE AYUDA DE INMEDIATO SI:   · Piensa que le ha quedado un tampón u otro objeto en la vagina.  · Tiene sangrado, dolor, hinchazón, o un mal olor proveniente de la vagina.  · Observa líquido proveniente de la vagina.  · Le duele al hacer pis (orinar).  · Tiene fiebre.  · Tiene escalofríos, se siente débil o se desmaya (se desvanece).  · Siente dolor de estómago (abdominal).  · Se sacude (tiene convulsiones), comienza a devolver (vomitar) o tiene heces acuosas (diarrea).  ASEGÚRESE DE QUE:   · Comprende estas instrucciones.  · Controlará su enfermedad.  · Solicitará ayuda de inmediato si no mejora o si empeora.  Document Released: 04/10/2012  [employer]® Patient Information ©2014 [employer].

## 2013-11-18 NOTE — Progress Notes (Signed)
Subjective:     Patient ID: Jodi Kelly, female   DOB: 1981-01-22, 33 y.o.   MRN: [id number]  HPI FB in vagina:C/O foreign body in her vagina, she started her period yesterday and decided to use tampon for the first time, later that day she noticed a plastic material droping off her vagina,since then she has been having burning sensation in her vagina and abdominal pain, she feels the tampon is inside her.   Current Outpatient Prescriptions on File Prior to Visit  Medication Sig Dispense Refill  . budesonide (RHINOCORT AQUA) 32 MCG/ACT nasal spray 2 sprays by Nasal route daily. In each nostril x 10 days, then as needed for congestion. Label in [place]       . Prenatal Multivit-Min-Fe-FA (PRENATAL VITAMINS) 0.8 MG tablet Take 1 tablet by mouth daily.         No current facility-administered medications on file prior to visit.   Past Medical History  Diagnosis Date  . Gestational diabetes 2012  . Cesarean delivery delivered 03/06/2011     Review of Systems  Respiratory: Negative.   Cardiovascular: Negative.   Gastrointestinal: Negative.   Genitourinary: Positive for dysuria. Negative for vaginal bleeding, vaginal discharge, difficulty urinating and pelvic pain.  All other systems reviewed and are negative.  Filed Vitals:   11/18/13 1103  BP: 111/75  Pulse: 70  Temp: 98.2 F (36.8 C)  TempSrc: Oral  Weight: 162 lb (73.483 kg)       Objective:   Physical Exam  Nursing note and vitals reviewed. Constitutional: She appears well-developed. No distress.  Cardiovascular: Normal rate, regular rhythm, normal heart sounds and intact distal pulses.   No murmur heard. Pulmonary/Chest: Effort normal and breath sounds normal. No respiratory distress. She has no wheezes. She exhibits no tenderness.  Abdominal: Soft. Bowel sounds are normal. She exhibits no distension and no mass. There is no tenderness.  Genitourinary: Uterus normal. Pelvic exam was performed with patient supine. Cervix  exhibits no motion tenderness and no discharge. Right adnexum displays no mass. Left adnexum displays no mass.         Assessment:     FB in vagina  Dysuria    Plan:     Check problem list

## 2014-01-08 ENCOUNTER — Encounter: Payer: Self-pay | Admitting: Family Medicine

## 2014-06-01 ENCOUNTER — Encounter: Payer: Self-pay | Admitting: Family Medicine

## 2014-09-28 ENCOUNTER — Encounter: Payer: Self-pay | Admitting: Family Medicine

## 2014-10-05 ENCOUNTER — Other Ambulatory Visit (HOSPITAL_COMMUNITY)
Admission: RE | Admit: 2014-10-05 | Discharge: 2014-10-05 | Disposition: A | Discharge status: Home / Self Care | Payer: Self-pay | Source: Ambulatory Visit | Attending: Family Medicine | Admitting: Family Medicine

## 2014-10-05 ENCOUNTER — Ambulatory Visit (INDEPENDENT_AMBULATORY_CARE_PROVIDER_SITE_OTHER): Payer: Self-pay | Admitting: Family Medicine

## 2014-10-05 ENCOUNTER — Encounter: Payer: Self-pay | Admitting: Family Medicine

## 2014-10-05 VITALS — BP 111/66 | HR 73 | Temp 98.1°F | Ht 58.5 in | Wt 165.5 lb

## 2014-10-05 DIAGNOSIS — M7989 Other specified soft tissue disorders: Secondary | ICD-10-CM | POA: Insufficient documentation

## 2014-10-05 DIAGNOSIS — Z01411 Encounter for gynecological examination (general) (routine) with abnormal findings: Secondary | ICD-10-CM | POA: Insufficient documentation

## 2014-10-05 DIAGNOSIS — Z01419 Encounter for gynecological examination (general) (routine) without abnormal findings: Secondary | ICD-10-CM

## 2014-10-05 DIAGNOSIS — Z13220 Encounter for screening for lipoid disorders: Secondary | ICD-10-CM

## 2014-10-05 DIAGNOSIS — M799 Soft tissue disorder, unspecified: Secondary | ICD-10-CM

## 2014-10-05 DIAGNOSIS — Z1151 Encounter for screening for human papillomavirus (HPV): Secondary | ICD-10-CM | POA: Insufficient documentation

## 2014-10-05 LAB — LIPID PANEL
CHOL/HDL RATIO: 15.7 ratio
CHOLESTEROL: 188 mg/dL (ref 0–200)
HDL: 12 mg/dL — AB (ref >=46)
LDL Cholesterol: 125 mg/dL — ABNORMAL HIGH (ref 0–99)
Triglycerides: 257 mg/dL — ABNORMAL HIGH (ref <150)
VLDL: 51 mg/dL — AB (ref 0–40)

## 2014-10-05 NOTE — Progress Notes (Signed)
Subjective:    Patient ID: Jodi Kelly, female    DOB: [birth year], 34 y.o.   MRN: [id number]  Visit conducted in [place]. In-person interpretation by Truitt Merle [facility]).  HPI: Pt presents to clinic for her annual physical exam. She generally feels well. She occasionally has pain in her hands bilaterally as well as her neck on the right side. She has had these symptoms off and on for about 2 months. She also states she feels occasional tingling in her anterior left thigh and a "knot" that "sometimes gets bigger at her left hip fold." She denies frank numbness or weakness. She is due for a Pap smear. She would also like a flu shot. Pt is a never smoker.  Family History  Problem Relation Age of Onset  . Hypertension Father   . Hyperlipidemia Father   . Diabetes Mother   . Diabetes Brother     Past Medical History  Diagnosis Date  . Gestational diabetes 2012  . Cesarean delivery delivered 03/06/2011    Past Surgical History  Procedure Laterality Date  . Tubal ligation  2012    History   Social History  . Marital Status: Married    Spouse Name: N/A  . Number of Children: N/A  . Years of Education: N/A   Occupational History  . Not on file.   Social History Main Topics  . Smoking status: Never Smoker   . Smokeless tobacco: Never Used  . Alcohol Use: No  . Drug Use: No  . Sexual Activity:    Partners: Male    Birth Control/ Protection: Surgical     Comment: tubal ligation   Other Topics Concern  . Not on file   Social History Narrative   In addition to the above documentation, pt's PMH, surgical history, FH, and SH all reviewed and updated where appropriate in the EMR. I have also reviewed and updated the pt's allergies and current medications as appropriate.  Review of Systems: As above. Otherwise, full 12-system ROS was reviewed and all negative.     Objective:   Physical Exam BP 111/66 mmHg  Pulse 73  Temp(Src) 98.1 F (36.7 C) (Oral)  Ht 4'  10.5" (1.486 m)  Wt 165 lb 8 oz (75.07 kg)  BMI 34.00 kg/m2  LMP 09/23/2014 Gen: well-appearing adult female in NAD HEENT: [place]/AT, sclerae/conjunctivae clear, no lid lag, EOMI, PERRLA   MMM, posterior oropharynx clear, no cervical lymphadenopathy  neck supple with full ROM, no masses appreciated; thyroid not enlarged  No palpable abnormality of scalp, neck, or shoulders Cardio: RRR, no murmur appreciated; distal pulses intact/symmetric Pulm: CTAB, no wheezes, normal WOB  Abd: soft, nondistended, BS+, no HSM GU: external vaginal / vulvar structures without suspicious lesions  Speculum exam: normal vaginal vault; some friability of cervix without frank bleeding / discharge  Bimanual exam: deferred in asymptomatic female with otherwise normal GU exam Ext: warm/well-perfused, no cyanosis/clubbing/edema MSK: strength 5/5 in all four extremities, no frank joint deformity/effusion  normal ROM to all four extremities with no point muscle/bony tenderness in spine  Mild muscle spasm of neck on the right  Equivocal Phalen and Tinnel testing of bilateral hands Neuro/Psych: alert/oriented, sensation grossly intact; normal gait/balance  mood euthymic with congruent affect Skin: approximately 1 cm x 2 cm, soft, nontender, cystic-type soft-tissue mass of anterior left thigh  Otherwise no lesions or rashes noted     Assessment & Plan:  34yo female in general good health, overweight and with soft-tissue mass of  left anterior thigh, with likely minor MSK pain of neck and hands - advised OTC analgesics for aches and pains - offered referral to dermatology for soft tissue (likely cyst) of thigh but pt declines due to no insurance and will monitor clinically, for now  Anticipatory guidance / Risk factor reduction - recommended good PCP / dental / optometric general f/u (i.e., regular wellness visits) - strongly encouraged improved diet and exercise habits in general to reduce BMI  Immunization /  screening / ancillary studies  - advised follow-up at outpt pharmacy for flu shot due to cost with no insurance - Pap smear done today - lipid panel drawn today  Follow-up in 1 year for wellness visit or sooner based on lab results or any changes to suspected cyst on left leg, or other new symptoms.  Emmaline Kluver, MD PGY-3, [facility] 10/05/2014, 4:20 PM

## 2014-10-05 NOTE — Patient Instructions (Signed)
Thank you for coming in, today!  Everything looks okay, today. I will send you a letter with your blood work and pap smear results. You can take Zantac (ranitidine) or Prilosec (omeprazole) over the counter for heartburn. I think your head / neck and arm pain is from muscle tightness and joint irritation. You can take Tylenol, ibuprofen (Motrin), naproxyn (Aleve), or similar medicines for this pain.  Come back to see [place] in 1 year, or sooner as you need.  Please feel free to call with any questions or concerns at any time, at [phone]. --Dr. Venetia Maxon

## 2014-10-07 LAB — CYTOLOGY - PAP

## 2014-10-09 ENCOUNTER — Telehealth: Payer: Self-pay | Admitting: Family Medicine

## 2014-10-09 DIAGNOSIS — R87611 Atypical squamous cells cannot exclude high grade squamous intraepithelial lesion on cytologic smear of cervix (ASC-H): Secondary | ICD-10-CM

## 2014-10-09 DIAGNOSIS — R87619 Unspecified abnormal cytological findings in specimens from cervix uteri: Secondary | ICD-10-CM | POA: Insufficient documentation

## 2014-10-09 NOTE — Telephone Encounter (Signed)
NOTE: Pt speaks Spanish only  Red Team / Lavanya: Please call pt to tell her her Pap smear was abnormal and I recommend that she come into GYN clinic to have a colposcopy done. Colposcopy is a procedure similar to a Pap smear that will give [place] more information and let us know what else (if anything) needs to be done. Her cholesterol numbers are high but I don't recommend a medication for it, at this time. Thanks. --CMS

## 2014-10-09 NOTE — Telephone Encounter (Signed)
Attempted to call pt with spanish interpreter antonio [phone]) pt wasn't home told daughter to tell her to call us back Monday or i will re-try then

## 2014-11-03 NOTE — Telephone Encounter (Signed)
Left message on voicemail via [place] interpreters [phone]) for patient to call back regarding abnormal test result.

## 2015-04-07 ENCOUNTER — Ambulatory Visit (INDEPENDENT_AMBULATORY_CARE_PROVIDER_SITE_OTHER): Payer: Self-pay

## 2015-04-07 ENCOUNTER — Ambulatory Visit (INDEPENDENT_AMBULATORY_CARE_PROVIDER_SITE_OTHER): Payer: Self-pay | Admitting: Emergency Medicine

## 2015-04-07 VITALS — BP 102/64 | HR 84 | Temp 98.3°F | Resp 18 | Ht 59.0 in | Wt 163.0 lb

## 2015-04-07 DIAGNOSIS — Z833 Family history of diabetes mellitus: Secondary | ICD-10-CM

## 2015-04-07 DIAGNOSIS — M549 Dorsalgia, unspecified: Secondary | ICD-10-CM

## 2015-04-07 DIAGNOSIS — R7309 Other abnormal glucose: Secondary | ICD-10-CM

## 2015-04-07 DIAGNOSIS — Z23 Encounter for immunization: Secondary | ICD-10-CM

## 2015-04-07 DIAGNOSIS — E669 Obesity, unspecified: Secondary | ICD-10-CM | POA: Insufficient documentation

## 2015-04-07 DIAGNOSIS — R631 Polydipsia: Secondary | ICD-10-CM

## 2015-04-07 DIAGNOSIS — R7303 Prediabetes: Secondary | ICD-10-CM | POA: Insufficient documentation

## 2015-04-07 LAB — POCT GLYCOSYLATED HEMOGLOBIN (HGB A1C): Hemoglobin A1C: 5.9

## 2015-04-07 MED ORDER — CYCLOBENZAPRINE HCL 10 MG PO TABS: 5.0000 mg | ORAL_TABLET | Freq: Every day | ORAL | Status: DC

## 2015-04-07 MED ORDER — MELOXICAM 15 MG PO TABS: 7.5000 mg | ORAL_TABLET | Freq: Every day | ORAL | Status: DC

## 2015-04-07 NOTE — Patient Instructions (Addendum)
Dolor de espalda en el adulto °(Back Pain, Adult) ° El dolor de cintura es frecuente. Aproximadamente 1 de cada 5 personas lo sufren. La causa rara vez pone en peligro la vida. Con frecuencia mejora luego de algún tiempo. Alrededor de la mitad de las personas que sufren un inicio súbito de dolor de cintura, se sentirán mejor luego de 2 semanas. Aproximadamente 8 de cada 10 se sentirán mejor luego de 6 semanas.  °CAUSAS  °Algunas causas comunes son:  °· Distensión de los músculos o ligamentos que sostienen la columna vertebral. °· Desgaste (degeneración) de los discos vertebrales. °· Artritis. °· Traumatismos directos en la espalda. °DIAGNÓSTICO  °La mayor parte de las veces, la causa directa no se conoce. Sin embargo, el dolor puede tratarse efectivamente aún cuando no se conozca la causa. Una de las formas más precisas de asegurar que la causa del dolor no constituye un peligro es responder a las preguntas del médico acerca de su salud y sus síntomas. Si el médico necesita más información, podrá indicar análisis de laboratorio o realizar un diagnóstico por imágenes (radiografías o resonancia magnética). Sin embargo, aunque las imágenes muestren modificaciones, generalmente no es necesaria la cirugía.  °INSTRUCCIONES PARA EL CUIDADO EN EL HOGAR  °En algunas personas, el dolor de espalda vuelve. Como rara vez es peligroso, los pacientes pueden aprender a manejarlo ellos mismos.  °· Manténgase activo. Si permanece sentado o de pie mucho tiempo en el mismo lugar, se tensiona la espalda.  No se siente, maneje ni se quede parado en un mismo lugar por más de 30 minutos. Realice caminatas cortas en superficies planas ni bien el dolor haya cedido. Trate de aumentar cada día el tiempo que camina . °· No se quede en la cama. Si hace reposo durante más de 1 o 2 días, puede retrasar la recuperación. °· No evite los ejercicios ni el trabajo. El cuerpo está hecho para moverse. No es peligroso estar activo, aunque le duela la  espalda. La espalda se curará más rápido si continúa sus actividades antes de que el dolor se vaya. °· Preste atención a su cuerpo cuando se incline y se levante. Muchas personas sienten menos molestias cuando levantan objetos si doblan las rodillas, mantienen la carga cerca del cuerpo y evitan torcerse. Generalmente, las posiciones más cómodas son las que ejercen menos tensión en la espalda en recuperación. °· Encuentre una posición cómoda para dormir. Utilice un colchón firme y recuéstese de costado. Doble ligeramente sus rodillas. Si se recuesta sobre su espalda, coloque una almohada debajo de sus rodillas. °· Tome sólo medicamentos de venta libre o recetados, según las indicaciones del médico. Los medicamentos de venta libre para calmar el dolor y reducir la inflamación, son los que en general más ayudan. El médico podrá prescribirle relajantes musculares. Estos medicamentos calman el dolor de modo que pueda retornar a sus actividades normales y a realizar ejercicios saludables. °· Aplique hielo sobre la zona lesionada. °¨ Ponga el hielo en una bolsa plástica. °¨ Colóquese una toalla entre la piel y la bolsa de hielo. °¨ Deje la bolsa de hielo durante 15 a 20 minutos 3 a 4 veces por día, durante los primeros 2 ó 3 días. Luego podrá alternar entre calor y hielo para reducir el dolor y los espasmos. °· Consulte a su médico si puede tratar de hacer ejercicios para la espalda y recibir un masaje suave. Pueden ser beneficiosos. °· Evite sentirse ansioso o estresado. El estrés aumenta la tensión muscular y puede empeorar el dolor de espalda. Es importante reconocer cuando está ansioso o estresado y aprender la forma   de controlarlos.El ejercicio es una gran opcin. SOLICITE ATENCIN MDICA SI:   Siente un dolor que no se alivia con reposo o medicamentos.  El dolor no mejora en 1 semana.  Desarrolla nuevos sntomas.  No se siente bien en general. SOLICITE ATENCIN MDICA DE INMEDIATO SI:  Siente un dolor  que se irradia desde la espalda hacia sus piernas.  Desarrolla nuevos problemas en el intestino o la vejiga.  Siente debilidad o adormecimiento inusual en sus brazos o piernas.  Presenta nuseas o vmitos.  Presenta dolor abdominal.  Se siente desfalleciente. Document Released: 07/17/2005 Document Revised: 01/16/2012 [facility] Patient Information 2015 [place], [place]. This information is not intended to replace advice given to you by your health care provider. Make sure you discuss any questions you have with your health care provider.   La diabetes mellitus y los alimentos (Diabetes Mellitus and Food) Es importante que controle su nivel de azcar en la sangre (glucosa). El nivel de glucosa en sangre depende en gran medida de lo que usted come. Comer alimentos saludables en las cantidades [place] a lo largo del Training and development officer, aproximadamente a la misma hora [employer], lo ayudar a Chief Technology Officer su nivel de Multimedia programmer. Tambin puede ayudarlo a retrasar o Patent attorney de la diabetes mellitus. Comer de [employer] saludable incluso puede ayudarlo a Chartered loss adjuster de presin arterial y a Science writer o Theatre manager un peso saludable.  CMO PUEDEN AFECTARME LOS ALIMENTOS? Carbohidratos Los carbohidratos afectan el nivel de glucosa en sangre ms que cualquier otro tipo de alimento. El nutricionista lo ayudar a Teacher, adult education cuntos carbohidratos puede consumir en cada comida y ensearle a contarlos. El recuento de carbohidratos es importante para mantener la glucosa en sangre en un nivel saludable, en especial si utiliza insulina o toma determinados medicamentos para la diabetes mellitus. Alcohol El alcohol puede provocar disminuciones sbitas de la glucosa en sangre (hipoglucemia), en especial si utiliza insulina o toma determinados medicamentos para la diabetes mellitus. La hipoglucemia es una afeccin que puede poner en peligro la vida. Los sntomas de la hipoglucemia (somnolencia, mareos y  Data processing manager) son similares a los sntomas de haber consumido mucho alcohol.  Si el mdico lo autoriza a beber alcohol, hgalo con moderacin y siga estas pautas:  Las mujeres no deben beber ms de un trago por da, y los hombres no deben beber ms de dos tragos por Training and development officer. Un trago es igual a:  12 onzas (355 ml) de cerveza  5 onzas de vino (150 ml) de vino  1,5onzas (46ml) de bebidas espirituosas  No beba con el estmago vaco.  Mantngase hidratado. Beba agua, gaseosas dietticas o t helado sin azcar.  Las gaseosas comunes, los jugos y otros refrescos podran contener muchos carbohidratos y se Civil Service fast streamer. QU ALIMENTOS NO SE RECOMIENDAN? Cuando haga las elecciones de alimentos, es importante que recuerde que todos los alimentos son distintos. Algunos tienen menos nutrientes que otros por porcin, aunque podran tener la misma cantidad de caloras o carbohidratos. Es difcil darle al cuerpo lo que necesita cuando consume alimentos con menos nutrientes. Estos son algunos ejemplos de alimentos que debera evitar ya que contienen muchas caloras y carbohidratos, pero pocos nutrientes:  Physicist, medical trans (la mayora de los alimentos procesados incluyen grasas trans en la etiqueta de Informacin nutricional).  Gaseosas comunes.  Jugos.  Caramelos.  Dulces, como tortas, pasteles, rosquillas y [place].  Comidas fritas. QU ALIMENTOS PUEDO COMER? Consuma alimentos ricos en nutrientes, que nutrirn el cuerpo y lo mantendrn saludable. Los [employer]  debe comer tambin dependern de varios factores, como:  Las caloras que necesita.  Los medicamentos que toma.  Su peso.  El nivel de glucosa en [place].  El [place] de presin arterial.  El nivel de colesterol. Tambin debe consumir una variedad de [place], como:  Protenas, como carne, aves, pescado, tofu, frutos secos y semillas (las protenas de [place] magros son mejores).  Lambert Mody.  Verduras.  Productos lcteos,  como [place], queso y yogur (descremados son mejores).  Panes, granos, pastas, cereales, arroz y frijoles.  Grasas, como aceite de [place], [place] sin grasas trans, aceite de canola, aguacate y [place]. TODOS LOS QUE PADECEN DIABETES MELLITUS TIENEN EL [place] PLAN DE [place]? Dado que todas las personas que padecen diabetes mellitus son distintas, no hay un solo plan de comidas que funcione para todos. Es muy importante que se rena con un nutricionista que lo ayudar a crear un plan de comidas adecuado para usted. Document Released: 10/24/2007 Document Revised: 07/22/2013 [facility] Patient Information 2015 [place]. This information is not intended to replace advice given to you by your health care provider. Make sure you discuss any questions you have with your health care provider.    Influenza Virus Vaccine injection (Fluarix) Qu es este medicamento? La VACUNA ANTIGRIPAL ayuda a disminuir el riesgo de contraer la influenza, tambin conocida como la gripe. La vacuna solo ayuda a protegerle contra algunas cepas de influenza. Esta vacuna no ayuda a reducir Catering manager de contraer influenza pandmica H1N1. Este medicamento puede ser utilizado para otros usos; si tiene alguna pregunta consulte con su proveedor de atencin mdica o con su farmacutico. MARCAS COMERCIALES DISPONIBLES: Fluarix, Fluzone Qu le debo informar a mi profesional de la salud antes de tomar este medicamento? Necesita saber si usted presenta alguno de los siguientes problemas o situaciones: -trastorno de sangrado como hemofilia -fiebre o infeccin -sndrome de Guillain-Barre u otros problemas neurolgicos -problemas del sistema inmunolgico -infeccin por el virus de la inmunodeficiencia humana (VIH) o SIDA -niveles bajos de plaquetas en la sangre -esclerosis mltiple -una Risk analyst o inusual a las vacunas antigripales, a los huevos, protenas de pollo, al ltex, a la gentamicina, a otros medicamentos,  alimentos, colorantes o conservantes -si est embarazada o buscando quedar embarazada -si est amamantando a un beb Cmo debo utilizar este medicamento? Esta vacuna se administra mediante inyeccin por va intramuscular. Lo administra un profesional de [employer]	[place]. Recibir una copia de informacin escrita sobre la vacuna antes de cada vacuna. Asegrese de leer este folleto cada vez cuidadosamente. Este folleto puede cambiar con frecuencia. Hable con su pediatra para informarse acerca del uso de este medicamento en nios. Puede requerir atencin especial. Sobredosis: Pngase en contacto inmediatamente con un centro toxicolgico o una sala de urgencia si usted cree que haya tomado demasiado medicamento. ATENCIN: [employer] es solo para usted. No comparta este medicamento con nadie. Qu sucede si me olvido de una dosis? No se aplica en este caso. Qu puede interactuar con este medicamento? -quimioterapia o radioterapia -medicamentos que suprimen el sistema inmunolgico, tales como etanercept, anakinra, infliximab y adalimumab -medicamentos que tratan o previenen cogulos sanguneos, como warfarina -fenitona -medicamentos esteroideos, como la prednisona o la cortisona -teofilina -vacunas Puede ser que esta lista no menciona todas las posibles interacciones. Informe a su profesional de [employer]	[place] de [employer] productos a base de hierbas, medicamentos de [place] o suplementos nutritivos que est tomando. Si usted fuma, consume bebidas alcohlicas o si utiliza drogas ilegales, indqueselo tambin a su profesional de la  salud. Algunas sustancias pueden interactuar con su medicamento. A qu debo estar atento al usar [employer]? Informe a su mdico o a Barrister's clerk de la [employer] todos los efectos secundarios que persistan despus de 3 das. Llame a su proveedor de atencin mdica si se presentan sntomas inusuales dentro de las 6 semanas posteriores a la vacunacin. Es posible  que todava pueda contraer la gripe, pero la enfermedad no ser tan fuerte como normalmente. No puede contraer la gripe de esta vacuna. La vacuna antigripal no le protege contra resfros u otras enfermedades que pueden causar [place]. Debe vacunarse cada ao. Qu efectos secundarios puedo tener al [employer] este medicamento? Efectos secundarios que debe informar a su mdico o a Barrister's clerk de la salud tan pronto como sea posible: -reacciones alrgicas como erupcin cutnea, picazn o urticarias, hinchazn de la cara, labios o lengua Efectos secundarios que, por lo general, no requieren atencin mdica (debe informarlos a su mdico o a su profesional de la salud si persisten o si son molestos): -fiebre -dolor de cabeza -molestias y dolores musculares -dolor, sensibilidad, enrojecimiento o Estate agent de la inyeccin -cansancio o debilidad Puede ser que esta lista no menciona todos los posibles efectos secundarios. Comunquese a su mdico por asesoramiento mdico [employer]. Usted puede informar los efectos secundarios a la FDA por telfono al 1-800-FDA-1088. Dnde debo guardar mi medicina? Esta vacuna se administra solamente en clnicas, farmacias, consultorio mdico u otro consultorio de un profesional de la salud y no Sports coach en su domicilio. ATENCIN: Este folleto es un resumen. Puede ser que no cubra toda la posible informacin. Si usted tiene preguntas acerca de esta medicina, consulte con su mdico, su farmacutico o su profesional de Technical sales engineer.  2015, Elsevier/Gold Standard. (2010-01-18 15:31:40)

## 2015-04-07 NOTE — Progress Notes (Signed)
MRN: [id number] DOB: Jan 07, 1981  Subjective:   Jodi Kelly is a 34 y.o. female presenting for chief complaint of Back Pain and Neck Pain  Back/Neck - reports ~2 week history of neck and mid back pain. States that she woke up with this pain, cannot recall any trauma including car accident or lifting any heavy objects. Patient states that she has difficulty turning her head as well as bending. Has tried Flanax once with some relief. 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 She has 4 children and takes care of everything at home. Denies fever, numbness, tingling, radiation of pain into her arms and legs.   Diabetes - reports a history of gestational diabetes, family history of diabetes with her mother and brother. Patient is worried that she has a recurrence. She has been feeling really thirsty the last couple of days. Has had intermittent numbness and tingling in her feet. Patient admits an unhealthy diet, lots of greasy and fatty foods, drinks sodas every day. Does not exercise. Denies chest pain, nausea, vomiting, abdominal pain, blurred vision, cuts or wounds that won't heal, weight loss. She would like to be checked for diabetes today.  Denies any other aggravating or relieving factors, no other questions or concerns.  Jodi Kelly has a current medication list which includes the following prescription(s): budesonide. Also is allergic to hydrocodone.  Jodi Kelly  has a past medical history of Gestational diabetes (2012) and Cesarean delivery delivered (03/06/2011). Also  has past surgical history that includes Tubal ligation (2012) and Cesarean section.  Objective:   Vitals: BP 102/64 mmHg  Pulse 84  Temp(Src) 98.3 F (36.8 C) (Oral)  Resp 18  Ht 4\' 11"  (1.499 m)  Wt 163 lb (73.936 kg)  BMI 32.90 kg/m2  SpO2 98%  LMP 03/21/2015  Physical Exam  Constitutional: She is oriented to person, place, and time. She appears well-developed and well-nourished.  HENT:    Mouth/Throat: Oropharynx is clear and moist.  Eyes: Conjunctivae are normal.  Cardiovascular: Normal rate, regular rhythm and intact distal pulses.  Exam reveals no gallop and no friction rub.   No murmur heard. Pulmonary/Chest: No respiratory distress. She has no wheezes. She has no rales.  Abdominal: Soft. Bowel sounds are normal. She exhibits no distension and no mass. There is no tenderness.  Musculoskeletal: She exhibits no edema.  Diffuse tenderness over entire spine, paraspinal muscles, trapezius muscles. No bony deformity. SLR negative.  Lymphadenopathy:    She has no cervical adenopathy.  Neurological: She is alert and oriented to person, place, and time.  Skin: Skin is warm and dry. No rash noted. No erythema. No pallor.   [facility] reading (PRIMARY) by  Dr. Everlene Farrier and PA-Alim Cattell.  Cervical - loss of lordosis, otherwise normal. Thoracic - normal. Lumbar - normal.  Dg Cervical Spine Complete  04/07/2015   CLINICAL DATA:  Bilateral neck pain of unknown origin. No recent injury.  EXAM: CERVICAL SPINE  4+ VIEWS  COMPARISON:  None.  FINDINGS: There is no evidence of cervical spine fracture or prevertebral soft tissue swelling. Alignment is normal. No other significant bone abnormalities are identified.  IMPRESSION: Negative cervical spine radiographs.   Electronically Signed   By: Marijo Conception, M.D.   On: 04/07/2015 14:49   Dg Thoracic Spine 2 View  04/07/2015   CLINICAL DATA:  Back pain of unknown origin.  EXAM: THORACIC SPINE 2 VIEWS  COMPARISON:  None.  FINDINGS: There is no evidence of thoracic spine fracture.  Alignment is normal. No other significant bone abnormalities are identified.  IMPRESSION: Negative.   Electronically Signed   By: Abelardo Diesel M.D.   On: 04/07/2015 14:46   Dg Lumbar Spine Complete  04/07/2015   CLINICAL DATA:  Bilateral back pain  EXAM: LUMBAR SPINE - COMPLETE 4+ VIEW  COMPARISON:  None.  FINDINGS: There is no evidence of lumbar spine fracture. Alignment is normal.  Intervertebral disc spaces are maintained.  Tubal ligation clips noted.  IMPRESSION: Negative lumbar spine.   Electronically Signed   By: Monte Fantasia M.D.   On: 04/07/2015 14:46   Results for orders placed or performed in visit on 04/07/15 (from the past 24 hour(s))  POCT glycosylated hemoglobin (Hb A1C)     Status: None   Collection Time: 04/07/15 11:48 AM  Result Value Ref Range   Hemoglobin A1C 5.9    Assessment and Plan :   1. Bilateral back pain, unspecified location - Will start conservative management with muscle relaxants and NSAIDs, consider physical therapy in the future if no improvement after a course of weeks. - DG Lumbar Spine Complete; Future - DG Thoracic Spine 2 View; Future - DG Cervical Spine Complete; Future - cyclobenzaprine (FLEXERIL) 10 MG tablet; Take 0.5-1 tablets (5-10 mg total) by mouth at bedtime.  Dispense: 30 tablet; Refill: 0 - meloxicam (MOBIC) 15 MG tablet; Take 0.5-1 tablets (7.5-15 mg total) by mouth daily.  Dispense: 30 tablet; Refill: 0  2. Pre-diabetes 3. Polydipsia 4. Obesity 5. Family history of diabetes mellitus - Recommended dietary modifications, weight loss, start exercising. Followup in 6 months to one year. - POCT glycosylated hemoglobin (Hb A1C)  6. Needs flu shot - Flu Vaccine QUAD 36+ mos IM   Jodi Eagles, PA-C [facility] [phone] 04/07/2015 11:17 AM

## 2017-08-14 ENCOUNTER — Ambulatory Visit (INDEPENDENT_AMBULATORY_CARE_PROVIDER_SITE_OTHER): Payer: Self-pay | Admitting: Internal Medicine

## 2017-08-14 ENCOUNTER — Encounter: Payer: Self-pay | Admitting: Internal Medicine

## 2017-08-14 VITALS — BP 100/60 | HR 71 | Temp 98.1°F | Ht 59.0 in | Wt 167.8 lb

## 2017-08-14 DIAGNOSIS — M542 Cervicalgia: Secondary | ICD-10-CM

## 2017-08-14 MED ORDER — CYCLOBENZAPRINE HCL 5 MG PO TABS: 5.0000 mg | ORAL_TABLET | Freq: Three times a day (TID) | ORAL | 0 refills | Status: DC | PRN

## 2017-08-14 MED ORDER — CYCLOBENZAPRINE HCL 5 MG PO TABS: 5.0000 mg | ORAL_TABLET | Freq: Three times a day (TID) | ORAL | 1 refills | Status: DC | PRN

## 2017-08-14 NOTE — Patient Instructions (Signed)
Jodi Kelly,  Siento que ests teniendo [employer]. Sospecho que tienes una distensin muscular despus de la cada que debera ser ayudada por un relajante muscular. Un efecto secundario de este medicamento es tener sueo, por lo que le recomiendo que lo pruebe antes de [place]. Contine con la formacin de hielo, ibuprofeno y tylenol para el dolor en la parte posterior de la cabeza.  Le he ordenado que obtenga una radiografa del cuello para asegurarse de [employer] discos en su cuello presionen un nervio. No necesitas una cita. Te llamar una vez que el radilogo haya ledo la imagen.  Por favor regrese para un chequeo de bienestar tan pronto como sea posible. Tuvo una prueba de Papanicolaou anormal en 2016 que debe repetirse.  Recomiendo vacunarse contra la gripe en su farmacia local.  Mejor, Dr. Ola Spurr   Ms. Peregrina,  I'm sorry you are having so much pain. I suspect you have muscle strain after the fall that should be helped by muscle relaxant. A side effect of this medication is being sleepy, so I recommend you try it first before bed. Continue icing, ibuprofen and tylenol for the pain at the back of your head.  I have ordered for you to get a neck xray to make sure the discs in your neck are pushing on a nerve. You do not need an appointment. I will call you once the radiologist has read the picture.   Please return for a wellness check at your earliest convenience. You had an abnormal pap smear in 2016 that needs to be repeated.  I recommend getting the flu shot at your local pharmacy.  Best, Dr. Ola Spurr

## 2017-08-14 NOTE — Progress Notes (Signed)
[facility] Progress Note  Subjective:  Jodi Kelly is a 37 y.o. Visit assisted by Spanish video interpreter.   #Head/neck pain: [place] on Thursday while standing on a chair to clean the top of her refrigerator.  - Hit the back of her head and noted swelling that has improved but still has pain at area. Denies loss of consciousness, nausea, or vomiting. 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 - Feels some warmth under her eyes.  - Has tried ibuprofen and tylenol without much relief. - Pain can bring her to tears at times. - Reports pain can spread from her neck into bilateral upper arms but not into her hands.  ROS: No vision changes, no headaches beyond where she hit her head  Of note, patient due for wellness exam. Had abnormal pap smear in 2016 with colposcopy recommended, but patient says she never got a message about this.   Allergies  Allergen Reactions  . Hydrocodone Nausea And Vomiting    Social History   Tobacco Use  . Smoking status: Never Smoker  . Smokeless tobacco: Never Used  Substance Use Topics  . Alcohol use: No    Objective: Blood pressure 100/60, pulse 71, temperature 98.1 F (36.7 C), temperature source Oral, weight 167 lb 12.8 oz (76.1 kg), last menstrual period 07/15/2017, SpO2 99 %. Body mass index is 33.89 kg/m. Constitutional: Well-appearing female in NAD though became tearful due to pain during exam HENT: PERRL, EOMI Cardiovascular: RRR, S1, S2, no m/r/g.  Pulmonary/Chest: Effort normal and breath sounds normal.  Musculoskeletal: TTP over cervical spine and upper paraspinal muscles. Limited lateral rotation to R of neck. Could nearly touch chin to chest with slow movement. TTP over R occiput without palpable swelling. Neurological: AOx3. Grip strength intact. CNII-XII intact. Positive Spurling's.  Skin: Skin is warm and dry. No rash noted.   Psychiatric: Anxious affect.  Vitals reviewed  Assessment/Plan: Neck pain - Occurred after fall. Given positive Spurling's and patient's degree of reported pain, concern for possible cervical radiculopathy. Suspect muscle strain largely contributing with increased muscle tension appreciated on exam and decreased ROM with lateral rotation to R of neck. Patient without LOC and did not fall from high height, so do not suspect concussion. - Prescribed flexeril for muscle strain. - Ibuprofen/tylenol and icing for pain at back of head.  - Recommended and ordered cervical spine x-ray to look for herniated disc.  - Counseled patient that pain would likely continue for a couple weeks and to try to remain somewhat active to avoid muscle stiffness.  - If pain continues beyond a couple weeks, consider gabapentin. - Gave return precautions of headache that does not go away/vomiting.  - Offered to write note for work but patient declines. Recommended and demonstrated lifting from the knees.   Follow-up in about 1 month to assess for improvement and for wellness check with repeat pap--may need colposcopy if results abnormal.   Olene Floss, MD [place], PGY-3

## 2017-08-15 ENCOUNTER — Encounter: Payer: Self-pay | Admitting: Internal Medicine

## 2017-08-15 ENCOUNTER — Encounter (HOSPITAL_COMMUNITY): Payer: Self-pay

## 2017-08-15 ENCOUNTER — Emergency Department (HOSPITAL_COMMUNITY)
Admission: EM | Admit: 2017-08-15 | Discharge: 2017-08-15 | Disposition: A | Discharge status: Home / Self Care | Payer: Self-pay | Attending: Emergency Medicine | Admitting: Emergency Medicine

## 2017-08-15 ENCOUNTER — Emergency Department (HOSPITAL_COMMUNITY): Payer: Self-pay

## 2017-08-15 DIAGNOSIS — Y999 Unspecified external cause status: Secondary | ICD-10-CM | POA: Insufficient documentation

## 2017-08-15 DIAGNOSIS — Y92009 Unspecified place in unspecified non-institutional (private) residence as the place of occurrence of the external cause: Secondary | ICD-10-CM

## 2017-08-15 DIAGNOSIS — Y92000 Kitchen of unspecified non-institutional (private) residence as  the place of occurrence of the external cause: Secondary | ICD-10-CM | POA: Insufficient documentation

## 2017-08-15 DIAGNOSIS — W19XXXA Unspecified fall, initial encounter: Secondary | ICD-10-CM

## 2017-08-15 DIAGNOSIS — Z79899 Other long term (current) drug therapy: Secondary | ICD-10-CM | POA: Insufficient documentation

## 2017-08-15 DIAGNOSIS — Y939 Activity, unspecified: Secondary | ICD-10-CM | POA: Insufficient documentation

## 2017-08-15 DIAGNOSIS — M542 Cervicalgia: Secondary | ICD-10-CM | POA: Insufficient documentation

## 2017-08-15 DIAGNOSIS — W07XXXA Fall from chair, initial encounter: Secondary | ICD-10-CM | POA: Insufficient documentation

## 2017-08-15 DIAGNOSIS — S161XXA Strain of muscle, fascia and tendon at neck level, initial encounter: Secondary | ICD-10-CM | POA: Insufficient documentation

## 2017-08-15 MED ORDER — OXYCODONE-ACETAMINOPHEN 5-325 MG PO TABS
1.0000 | ORAL_TABLET | Freq: Once | ORAL | Status: AC
  Administered 2017-08-15: 1 via ORAL
  Filled 2017-08-15: qty 1

## 2017-08-15 MED ORDER — CYCLOBENZAPRINE HCL 10 MG PO TABS
10.0000 mg | ORAL_TABLET | Freq: Once | ORAL | Status: AC
  Administered 2017-08-15: 10 mg via ORAL
  Filled 2017-08-15: qty 1

## 2017-08-15 MED ORDER — IBUPROFEN 600 MG PO TABS: 600.0000 mg | ORAL_TABLET | Freq: Four times a day (QID) | ORAL | 0 refills | Status: DC | PRN

## 2017-08-15 MED ORDER — OXYCODONE-ACETAMINOPHEN 5-325 MG PO TABS: 1.0000 | ORAL_TABLET | ORAL | 0 refills | Status: DC | PRN

## 2017-08-15 MED ORDER — IBUPROFEN 400 MG PO TABS
600.0000 mg | ORAL_TABLET | Freq: Once | ORAL | Status: AC
  Administered 2017-08-15: 600 mg via ORAL
  Filled 2017-08-15: qty 1

## 2017-08-15 NOTE — Discharge Instructions (Signed)
Rest your neck and back for the next 2-3 days. Apply cool compresses for comfort. Take medications, including Flexeril as prescribed by your doctor, as instructed. See your doctor if pain does not improve.

## 2017-08-15 NOTE — ED Notes (Signed)
EDPA reports PT said she could get a ride home before meds were given. Per Charlann Lange, PA PT needs to remain in ER for 4 hours post med admin before she can be discharged without a ride. PT made aware. PT agrees and will attempt to contact family members for a ride.

## 2017-08-15 NOTE — ED Triage Notes (Signed)
Pt states that she had a fall three days ago off a chair while cleaning. Hit head, no LOC, c/o of neck pain, worse on the R, headache, back pain and unable to take a deep breath. Pt seen PCP yesterday, given prescription but has not got it filled yet. Pt has a paper stating she needs to have a neck x ray done. C-collar applied in triage.

## 2017-08-15 NOTE — Assessment & Plan Note (Addendum)
-   Occurred after fall. Given positive Spurling's and patient's degree of reported pain, concern for possible cervical radiculopathy. Suspect muscle strain largely contributing with increased muscle tension appreciated on exam and decreased ROM with lateral rotation to R of neck. Patient without LOC and did not fall from high height, so do not suspect concussion. - Prescribed flexeril for muscle strain. - Ibuprofen/tylenol and icing for pain at back of head.  - Recommended and ordered cervical spine x-ray to look for herniated disc.  - Counseled patient that pain would likely continue for a couple weeks and to try to remain somewhat active to avoid muscle stiffness.  - If pain continues beyond a couple weeks, consider gabapentin. - Gave return precautions of headache that does not go away/vomiting.  - Offered to write note for work but patient declines. Recommended and demonstrated lifting from the knees.

## 2017-08-15 NOTE — ED Notes (Signed)
ED Provider at bedside. 

## 2017-08-15 NOTE — ED Provider Notes (Signed)
[facility] EMERGENCY DEPARTMENT Provider Note   CSN: [id number] Arrival date & time: 08/15/17  0137     History   Chief Complaint Chief Complaint  Patient presents with  . Fall    HPI Jodi Kelly is a 37 y.o. female.  Patient presents with severe neck pain after fall 6 days ago. She was standing on a chair in the kitchen cleaning and landed on her back. She reports she hit her head but did not pass out. She complains of upper back and neck pain. No radiation into UE's, no weakness or numbness. The pain has gotten worse over time. She was seen by her primary care doctor yesterday and given pain management and an order for an outpatient cervical spine x-ray. She has not filled the medication prescription. She reports increased pain with movement of the neck and with deep breathing. No SOB or cough. No abdominal or chest pain. No nausea or vomiting.   The history is provided by the patient. A language interpreter was used (Via Video interpreter).  Fall  Pertinent negatives include no chest pain, no abdominal pain and no shortness of breath.    Past Medical History:  Diagnosis Date  . Cesarean delivery delivered 03/06/2011  . Gestational diabetes 2012    Patient Active Problem List   Diagnosis Date Noted  . 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 Obesity 04/07/2015  . Pre-diabetes 04/07/2015  . Abnormal Pap smear of cervix 10/09/2014  . Soft tissue mass 10/05/2014  . Breast pain in female 10/23/2012  . DIABETES MELLITUS, GESTATIONAL, HX OF 05/13/2009    Past Surgical History:  Procedure Laterality Date  . CESAREAN SECTION    . TUBAL LIGATION  2012    OB History    Gravida Para Term Preterm AB Living   6 5 3 2   4    SAB TAB Ectopic Multiple Live Births                   Home Medications    Prior to Admission medications   Medication Sig Start Date End Date Taking? Authorizing Provider  ibuprofen (ADVIL,MOTRIN) 200 MG tablet Take 200 mg by mouth every 6  (six) hours as needed for mild pain.   Yes [provider]  cyclobenzaprine (FLEXERIL) 5 MG tablet Take 1 tablet (5 mg total) by mouth 3 (three) times daily as needed for muscle spasms. Patient not taking: Reported on 08/15/2017 08/14/17   Rogue Bussing, MD  meloxicam (MOBIC) 15 MG tablet Take 0.5-1 tablets (7.5-15 mg total) by mouth daily. Patient not taking: Reported on 08/15/2017 04/07/15   Jaynee Eagles, PA-C    Family History Family History  Problem Relation Age of Onset  . Hypertension Father   . Hyperlipidemia Father   . Diabetes Mother   . Diabetes Brother   . Cancer Sister     Social History Social History   Tobacco Use  . Smoking status: Never Smoker  . Smokeless tobacco: Never Used  Substance Use Topics  . Alcohol use: No  . Drug use: No     Allergies   Hydrocodone   Review of Systems Review of Systems  Constitutional: Negative for chills and fever.  Respiratory: Negative for cough and shortness of breath.        See HPI.  Cardiovascular: Negative.  Negative for chest pain.  Gastrointestinal: Negative.  Negative for abdominal pain, nausea and vomiting.  Musculoskeletal: Positive for back pain and neck pain.  Skin: Negative.  Negative for wound.  Neurological: Negative.  Negative for weakness and numbness.     Physical Exam Updated Vital Signs BP 101/62   Pulse (!) 57   Temp 98.4 F (36.9 C) (Oral)   Resp 16   LMP 07/15/2017   SpO2 96%   Physical Exam  Constitutional: She is oriented to person, place, and time. She appears well-developed and well-nourished.  HENT:  Head: Normocephalic.  Neck: Normal range of motion. Neck supple.  Cardiovascular: Normal rate and regular rhythm.  Pulmonary/Chest: Effort normal and breath sounds normal. She has no wheezes. She has no rales. She exhibits no tenderness.  Abdominal: Soft. Bowel sounds are normal. There is no tenderness. There is no rebound and no guarding.  Musculoskeletal: Normal range  of motion.  Midline and bilateral cervical tenderness. No swelling. Normal and symmetric strength in bilateral UE's.   Neurological: She is alert and oriented to person, place, and time. No sensory deficit.  Skin: Skin is warm and dry. No rash noted.  Psychiatric: She has a normal mood and affect.     ED Treatments / Results  Labs (all labs ordered are listed, but only abnormal results are displayed) Labs Reviewed - No data to display  EKG  EKG Interpretation None       Radiology Dg Cervical Spine Complete  Result Date: 08/15/2017 CLINICAL DATA:  37 year old female with fall and neck pain. EXAM: CERVICAL SPINE - COMPLETE 4+ VIEW COMPARISON:  Cervical spine radiograph dated 04/07/2015 FINDINGS: There is no evidence of cervical spine fracture or prevertebral soft tissue swelling. Alignment is normal. No other significant bone abnormalities are identified. IMPRESSION: Negative cervical spine radiographs. Electronically Signed   By: Anner Crete M.D.   On: 08/15/2017 03:04    Procedures Procedures (including critical care time)  Medications Ordered in ED Medications  oxyCODONE-acetaminophen (PERCOCET/ROXICET) 5-325 MG per tablet 1 tablet (1 tablet Oral Given 08/15/17 0627)  ibuprofen (ADVIL,MOTRIN) tablet 600 mg (600 mg Oral Given 08/15/17 0627)  cyclobenzaprine (FLEXERIL) tablet 10 mg (10 mg Oral Given 08/15/17 9381)     Initial Impression / Assessment and Plan / ED Course  I have reviewed the triage vital signs and the nursing notes.  Pertinent labs & imaging results that were available during my care of the patient were reviewed by me and considered in my medical decision making (see chart for details).     Patient fell 6 days ago and is here for x-ray examination as recommended by her doctor. She did not fill her prescription for pain management so pain is significant currently. No neurologic deficits. Negative c-spine film.Full breath sounds to all fields, no hypoxia.   Pain medications provided.   On recheck, the patient states she is better. She is felt appropriate for discharge home.   Final Clinical Impressions(s) / ED Diagnoses   Final diagnoses:  None   1. Cervical strain  ED Discharge Orders    None       Charlann Lange, Hershal Coria 01/75/10 2585    Delora Fuel, MD 27/78/24 2242

## 2017-08-15 NOTE — ED Notes (Signed)
PT willing to sign out for discharge, but computer will not boot up. PT ambulates out of department. PT reports she is comfortable driving home. It has been four hours since med admin. PT has remained alert and oriented since meds were given.

## 2017-08-15 NOTE — ED Notes (Signed)
PT reports she drove herself here and cannot have someone pick her up. EDPA made aware.

## 2017-08-17 ENCOUNTER — Ambulatory Visit (INDEPENDENT_AMBULATORY_CARE_PROVIDER_SITE_OTHER): Payer: Self-pay | Admitting: Family Medicine

## 2017-08-17 ENCOUNTER — Encounter: Payer: Self-pay | Admitting: Family Medicine

## 2017-08-17 DIAGNOSIS — L989 Disorder of the skin and subcutaneous tissue, unspecified: Secondary | ICD-10-CM | POA: Insufficient documentation

## 2017-08-17 DIAGNOSIS — M542 Cervicalgia: Secondary | ICD-10-CM

## 2017-08-17 MED ORDER — SULFAMETHOXAZOLE-TRIMETHOPRIM 800-160 MG PO TABS: 1.0000 | ORAL_TABLET | Freq: Two times a day (BID) | ORAL | 0 refills | Status: DC

## 2017-08-17 NOTE — Assessment & Plan Note (Addendum)
Pain improving.  Patient reports feeling better after ED visit 2 days ago, although with mild headache today.  She is tender over posterior neck and restricted in her ROM laterally but otherwise does not have any red flags noted on exam.  XR in ED without evidence of fracture or soft tissue swelling. Suspect her symptoms may be related to a cervical strain and will resolve over next several weeks  -recommend continue Ibuprofen and Flexeril if needed -return precautions discussed

## 2017-08-17 NOTE — Progress Notes (Signed)
Subjective:   Patient ID: Jodi Kelly    DOB: [birth year], 37 y.o. female   MRN: [id number]  CC: f/u ED visit   HPI: Jodi Kelly is a 37 y.o. female who presents to clinic today for f/u ED visit. Patient is Spanish-speaking and video interpreter was used for the encounter: Lavonna Monarch, ID# [id number].  Cervical pain Sustained a fall 8 days ago at her home.  She was cleaning her fridge and used a chair to step on.  The chair flipped and she fell off.  Reports she hit her head upon falling but did not lose consciousness.   She had some severe neck pain and a headache following the fall for which she took some Tylenol and Ibuprofen.  She presented to the ED 2 days ago on Wednesday night due to pain.  XR of c-spine did not show any fracture or soft tissue swelling and she was discharged with pain medication including percocet, flexeril and ibuprofen.  She has not had to use much of this and is feeling much better.  Complaining of a mild headache and some tenderness over back of her neck but feels like her pain is improving.    Additionally, she has a bump over the left side of her nasolabial fold which is painful to the touch.  She would like to know what it could be.  Does not have any drainage or other symptoms of fever, chills, nausea, vomiting.    ROS: denies fever, chills, nausea, vomiting, dizziness, difficulty balancing   PMFSH: Pertinent past medical, surgical, family, and social history were reviewed and updated as appropriate. Smoking status reviewed. Medications reviewed.  Objective:   BP 99/60 (BP Location: Right Arm, Patient Position: Sitting, Cuff Size: Normal)   Pulse 60   Temp 97.8 F (36.6 C) (Oral)   Ht 4\' 11"  (1.499 m)   Wt 170 lb 12.8 oz (77.5 kg)   LMP 07/26/2017 (Exact Date)   SpO2 98%   BMI 34.50 kg/m  Vitals and nursing note reviewed.  General: well nourished, well developed, in no acute distress with non-toxic appearance HEENT: NCAT, EOMI, PERRL, MMM, o/p clear,  1 cm tender lesion over L nasolabial fold with mild erythema and tenderness without underlying induration. No drainage or purulence present.   Neck: supple, restricted ROM 2/2 pain, TTP noted over cervical spine and upper paraspinal muscles CV: RRR no MRG  Lungs: CTAB, non-laboured Abdomen: soft, NTND, +bs  Skin: warm, dry, no rash Extremities: warm and well perfused Neuro: aox3, no focal deficits, positive Spurling's, normal strength 5/5 bilaterally in upper ext, sensation is intact  Assessment & Plan:   Neck pain Pain improving.  Patient reports feeling better after ED visit 2 days ago, although with mild headache today.  She is tender over posterior neck and restricted in her ROM laterally but otherwise does not have any red flags noted on exam.  XR in ED without evidence of fracture or soft tissue swelling. Suspect her symptoms may be related to a cervical strain and will resolve over next several weeks  -recommend continue Ibuprofen and Flexeril if needed -return precautions discussed   Skin abnormality 1 cm lesion over L nasolabial fold, mild fluctuance and tender to the touch.  No induration or surrounding erythema present.  Will trial Bactrim given no area to drain.   -Rx Bactrim 500 mg BID x 7 days  -return precautions discussed  Meds ordered this encounter  Medications  . sulfamethoxazole-trimethoprim (BACTRIM DS) 800-160 MG  tablet    Sig: Take 1 tablet by mouth 2 (two) times daily.    Dispense:  10 tablet    Refill:  0    Lovenia Kim, MD [place], PGY-2 08/17/2017 6:32 PM

## 2017-08-17 NOTE — Assessment & Plan Note (Signed)
1 cm lesion over L nasolabial fold, mild fluctuance and tender to the touch.  No induration or surrounding erythema present.  Will trial Bactrim given no area to drain.   -Rx Bactrim 500 mg BID x 7 days  -return precautions discussed

## 2017-08-17 NOTE — Patient Instructions (Signed)
You were seen in clinic for follow up after your visit to the ER.  I am glad you are feeling much better.  If you develop any new symptoms from your fall I would like for you to be seen again.   Additionally, it is likely you have a small skin infection under the left side of your nose.  I am prescribing you an antibiotic to take twice a day for 5 days.  You can pick this up from your pharmacy.   Be well, Lovenia Kim, MD

## 2017-09-12 ENCOUNTER — Encounter: Payer: Self-pay | Admitting: Family Medicine

## 2017-09-12 ENCOUNTER — Other Ambulatory Visit (HOSPITAL_COMMUNITY)
Admission: RE | Admit: 2017-09-12 | Discharge: 2017-09-12 | Disposition: A | Discharge status: Home / Self Care | Payer: Self-pay | Source: Ambulatory Visit | Attending: Family Medicine | Admitting: Family Medicine

## 2017-09-12 ENCOUNTER — Ambulatory Visit (INDEPENDENT_AMBULATORY_CARE_PROVIDER_SITE_OTHER): Payer: Self-pay | Admitting: Family Medicine

## 2017-09-12 ENCOUNTER — Other Ambulatory Visit: Payer: Self-pay

## 2017-09-12 VITALS — BP 104/60 | HR 61 | Temp 98.2°F | Ht 58.5 in | Wt 165.8 lb

## 2017-09-12 DIAGNOSIS — N898 Other specified noninflammatory disorders of vagina: Secondary | ICD-10-CM

## 2017-09-12 DIAGNOSIS — Z124 Encounter for screening for malignant neoplasm of cervix: Secondary | ICD-10-CM | POA: Insufficient documentation

## 2017-09-12 DIAGNOSIS — R87610 Atypical squamous cells of undetermined significance on cytologic smear of cervix (ASC-US): Secondary | ICD-10-CM | POA: Insufficient documentation

## 2017-09-12 DIAGNOSIS — B353 Tinea pedis: Secondary | ICD-10-CM

## 2017-09-12 LAB — POCT WET PREP (WET MOUNT)
CLUE CELLS WET PREP WHIFF POC: NEGATIVE
Trichomonas Wet Prep HPF POC: ABSENT

## 2017-09-12 MED ORDER — CLOTRIMAZOLE 1 % EX CREA: 1.0000 "application " | TOPICAL_CREAM | Freq: Two times a day (BID) | CUTANEOUS | 0 refills | Status: DC

## 2017-09-12 NOTE — Progress Notes (Signed)
   Subjective:   Patient ID: Jodi Kelly    DOB: [birth year], 37 y.o. female   MRN: [id number]  CC: rash on foot, vaginal discharge   HPI: Jodi Kelly is a 37 y.o. female who presents to clinic today for the following issues.    Vaginal discharge  Symptoms present x1 month.  Reports thick white discharge and she is concerned this is a yeast infection.  No odor or vaginal irritation.  Has not had any new sexual partners and she does not desire additional testing for STDs.  She denies abdominal pain and cramping.  No vaginal bleeding. Due for pap smear today.    Foot pain/rash Endorses pain x 4 months.  She reports her skin of her feet is dry and flaking.  Sometimes wears tight shoes.  She does not have this present anywhere else on the body.  Feet have felt sore x4 months.  The area between toes is painful when touched or rubbed against a sock or shoe.  Denies fevers, chills, nausea, vomiting.  Denies diarrhea, abdominal pain.    ROS: Denies fever, chills, nausea, vomiting.  Denies abdominal pain.    [place]: Pertinent past medical, surgical, family, and social history were reviewed and updated as appropriate. Smoking status reviewed. Medications reviewed. Objective:   BP 104/60 (BP Location: Left Arm, Patient Position: Sitting, Cuff Size: Normal)   Pulse 61   Temp 98.2 F (36.8 C) (Oral)   Ht 4' 10.5" (1.486 m)   Wt 165 lb 12.8 oz (75.2 kg)   LMP 09/12/2017   SpO2 98%   BMI 34.06 kg/m  Vitals and nursing note reviewed.  General: 37 year old female, NAD CV: RRR, no MRG Lungs: Clear bilaterally, normal effort Abdomen: soft, NT ND, positive bowel sounds Pelvic exam: VULVA: normal appearing vulva with no masses, tenderness or lesions, VAGINA: normal appearing vagina with normal color and discharge, no lesions, CERVIX: normal appearing cervix without discharge or lesions. Skin: warm, dry, no rash Extremities: warm and well perfused Foot-bilateral: Area of yellow flaking skin  between great and second toe of right foot, no surrounding erythema or warmth.  Mildly tender to palpation.  No bony abnormalities.  Normal sensation.  Assessment & Plan:   Vaginal discharge  -Wet prep -GC chlamydia, RPR and HIV declined    Tinea pedis Likely tinea pedis between great and second toe on R foot.  No evidence of deeper infection on exam.   -Will trial clotrimazole twice daily to be applied to the affected area.  Advised patient to avoid moisture and tight fitting shoes.  Health Maintenance -pap smear  -flu shot declined  Orders Placed This Encounter  Procedures  . POCT Wet Prep [facility])   Meds ordered this encounter  Medications  . clotrimazole (LOTRIMIN) 1 % cream    Sig: Apply 1 application topically 2 (two) times daily.    Dispense:  30 g    Refill:  0    Lovenia Kim, MD [place], PGY-2 09/17/2017 7:47 PM

## 2017-09-12 NOTE — Patient Instructions (Addendum)
You were seen in clinic today for vaginal discharge, rash on your foot, and for your Pap smear.  I have tested you for infection and will inform you the results once your Pap smear and wet prep return.  I will call in an medication to your pharmacy if needed at the time.  Additionally, we discussed that your rash on your foot is most likely due to a fungal infection called athlete's foot.  I have called in an antifungal ointment for you to apply twice a day to the affected areas.  If you do not see any signs of improvement, please return to be seen by provider.  Call clinic if you have any questions.  Be well,  Lovenia Kim, MD

## 2017-09-17 DIAGNOSIS — B353 Tinea pedis: Secondary | ICD-10-CM | POA: Insufficient documentation

## 2017-09-17 NOTE — Assessment & Plan Note (Signed)
Likely tinea pedis between great and second toe on R foot.  No evidence of deeper infection on exam.   -Will trial clotrimazole twice daily to be applied to the affected area.  Advised patient to avoid moisture and tight fitting shoes.

## 2017-09-18 LAB — CYTOLOGY - PAP
DIAGNOSIS: UNDETERMINED — AB
HPV (WINDOPATH): NOT DETECTED

## 2017-10-31 ENCOUNTER — Telehealth: Payer: Self-pay | Admitting: Family Medicine

## 2017-10-31 NOTE — Telephone Encounter (Signed)
Pt called wanting to know her results from her physical back in February. Please call her with these

## 2017-11-28 ENCOUNTER — Telehealth: Payer: Self-pay | Admitting: Family Medicine

## 2017-11-28 NOTE — Telephone Encounter (Signed)
Patient came into office request call from pcp to discuss 09/12/17 physical.  Patient needs a spanish interpreter to call. [phone]

## 2017-11-28 NOTE — Telephone Encounter (Signed)
Used [employer] to call pt back and discuss her pap results.  She is to repeat pap in one year vs repeat co-testing in 3 years given her age and finding of ASC-US.  She is agreeable to 1 year follow up.

## 2018-01-11 ENCOUNTER — Emergency Department (HOSPITAL_COMMUNITY)
Admission: EM | Admit: 2018-01-11 | Discharge: 2018-01-12 | Disposition: A | Discharge status: Home / Self Care | Payer: No Typology Code available for payment source | Attending: Emergency Medicine | Admitting: Emergency Medicine

## 2018-01-11 ENCOUNTER — Encounter (HOSPITAL_COMMUNITY): Payer: Self-pay

## 2018-01-11 ENCOUNTER — Emergency Department (HOSPITAL_COMMUNITY): Payer: No Typology Code available for payment source

## 2018-01-11 DIAGNOSIS — Y9389 Activity, other specified: Secondary | ICD-10-CM | POA: Diagnosis not present

## 2018-01-11 DIAGNOSIS — R1031 Right lower quadrant pain: Secondary | ICD-10-CM | POA: Insufficient documentation

## 2018-01-11 DIAGNOSIS — S3991XA Unspecified injury of abdomen, initial encounter: Secondary | ICD-10-CM | POA: Diagnosis present

## 2018-01-11 DIAGNOSIS — Y999 Unspecified external cause status: Secondary | ICD-10-CM | POA: Diagnosis not present

## 2018-01-11 DIAGNOSIS — Y92410 Unspecified street and highway as the place of occurrence of the external cause: Secondary | ICD-10-CM | POA: Insufficient documentation

## 2018-01-11 LAB — COMPREHENSIVE METABOLIC PANEL
ALT: 25 U/L (ref 14–54)
AST: 24 U/L (ref 15–41)
Albumin: 4.4 g/dL (ref 3.5–5.0)
Alkaline Phosphatase: 78 U/L (ref 38–126)
Anion gap: 6 (ref 5–15)
BILIRUBIN TOTAL: 0.6 mg/dL (ref 0.3–1.2)
BUN: 14 mg/dL (ref 6–20)
CHLORIDE: 106 mmol/L (ref 101–111)
CO2: 24 mmol/L (ref 22–32)
Calcium: 9.1 mg/dL (ref 8.9–10.3)
Creatinine, Ser: 0.57 mg/dL (ref 0.44–1.00)
Glucose, Bld: 116 mg/dL — ABNORMAL HIGH (ref 65–99)
POTASSIUM: 3.5 mmol/L (ref 3.5–5.1)
Sodium: 136 mmol/L (ref 135–145)
TOTAL PROTEIN: 8.1 g/dL (ref 6.5–8.1)

## 2018-01-11 LAB — CBC WITH DIFFERENTIAL/PLATELET
Basophils Absolute: 0 10*3/uL (ref 0.0–0.1)
Basophils Relative: 0 %
EOS PCT: 5 %
Eosinophils Absolute: 0.4 10*3/uL (ref 0.0–0.7)
HEMATOCRIT: 40.1 % (ref 36.0–46.0)
Hemoglobin: 13.5 g/dL (ref 12.0–15.0)
LYMPHS ABS: 2 10*3/uL (ref 0.7–4.0)
LYMPHS PCT: 22 %
MCH: 29.7 pg (ref 26.0–34.0)
MCHC: 33.7 g/dL (ref 30.0–36.0)
MCV: 88.3 fL (ref 78.0–100.0)
MONO ABS: 0.4 10*3/uL (ref 0.1–1.0)
MONOS PCT: 5 %
NEUTROS ABS: 6.2 10*3/uL (ref 1.7–7.7)
Neutrophils Relative %: 68 %
PLATELETS: 336 10*3/uL (ref 150–400)
RBC: 4.54 MIL/uL (ref 3.87–5.11)
RDW: 13.4 % (ref 11.5–15.5)
WBC: 9 10*3/uL (ref 4.0–10.5)

## 2018-01-11 LAB — I-STAT BETA HCG BLOOD, ED (MC, WL, AP ONLY)

## 2018-01-11 MED ORDER — IOPAMIDOL (ISOVUE-300) INJECTION 61%
100.0000 mL | Freq: Once | INTRAVENOUS | Status: AC | PRN
  Administered 2018-01-11: 100 mL via INTRAVENOUS

## 2018-01-11 MED ORDER — IOPAMIDOL (ISOVUE-300) INJECTION 61%
INTRAVENOUS | Status: AC
  Filled 2018-01-11: qty 100

## 2018-01-11 MED ORDER — ONDANSETRON HCL 4 MG/2ML IJ SOLN
4.0000 mg | Freq: Once | INTRAMUSCULAR | Status: AC
  Administered 2018-01-11: 4 mg via INTRAVENOUS
  Filled 2018-01-11: qty 2

## 2018-01-11 MED ORDER — SODIUM CHLORIDE 0.9 % IV BOLUS
500.0000 mL | Freq: Once | INTRAVENOUS | Status: AC
  Administered 2018-01-11: 500 mL via INTRAVENOUS

## 2018-01-11 MED ORDER — HYDROMORPHONE HCL 1 MG/ML IJ SOLN
0.5000 mg | Freq: Once | INTRAMUSCULAR | Status: AC
Start: 2018-01-11 — End: 2018-01-11
  Administered 2018-01-11: 0.5 mg via INTRAVENOUS
  Filled 2018-01-11: qty 1
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
## 2018-01-11 NOTE — ED Notes (Signed)
Pt aware urine sample is needed 

## 2018-01-11 NOTE — ED Notes (Signed)
Bed: YT46 Expected date:  Expected time:  Means of arrival:  Comments: Triage 5
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
## 2018-01-12 LAB — URINALYSIS, ROUTINE W REFLEX MICROSCOPIC
BILIRUBIN URINE: NEGATIVE
GLUCOSE, UA: NEGATIVE mg/dL
Hgb urine dipstick: NEGATIVE
KETONES UR: 20 mg/dL — AB
Leukocytes, UA: NEGATIVE
Nitrite: NEGATIVE
PH: 6 (ref 5.0–8.0)
PROTEIN: NEGATIVE mg/dL
Specific Gravity, Urine: 1.038 — ABNORMAL HIGH (ref 1.005–1.030)

## 2018-01-12 MED ORDER — OXYCODONE-ACETAMINOPHEN 5-325 MG PO TABS: 1.0000 | ORAL_TABLET | Freq: Four times a day (QID) | ORAL | 0 refills | Status: AC | PRN
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
## 2018-01-12 NOTE — Discharge Instructions (Addendum)
Tus pruebas se ven bien hoy. No hay hallazgos emergentes. Se observ un hallazgo incidental: un clip quirrgico de su ligadura de trompas ha movido posiciones en su pelvis. Por favor, discuta esto con su mdico.
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
## 2018-01-25 ENCOUNTER — Ambulatory Visit: Payer: Self-pay | Admitting: Family Medicine

## 2018-07-05 ENCOUNTER — Ambulatory Visit (INDEPENDENT_AMBULATORY_CARE_PROVIDER_SITE_OTHER): Payer: Self-pay | Admitting: Family Medicine

## 2018-07-05 ENCOUNTER — Other Ambulatory Visit (HOSPITAL_COMMUNITY)
Admission: RE | Admit: 2018-07-05 | Discharge: 2018-07-05 | Disposition: A | Discharge status: Home / Self Care | Payer: Self-pay | Source: Ambulatory Visit | Attending: Family Medicine | Admitting: Family Medicine

## 2018-07-05 ENCOUNTER — Encounter: Payer: Self-pay | Admitting: Family Medicine

## 2018-07-05 ENCOUNTER — Other Ambulatory Visit: Payer: Self-pay

## 2018-07-05 VITALS — BP 112/60 | HR 71 | Temp 98.6°F | Ht 58.5 in | Wt 167.8 lb

## 2018-07-05 DIAGNOSIS — N898 Other specified noninflammatory disorders of vagina: Secondary | ICD-10-CM

## 2018-07-05 LAB — POCT WET PREP (WET MOUNT)
CLUE CELLS WET PREP WHIFF POC: NEGATIVE
Trichomonas Wet Prep HPF POC: ABSENT

## 2018-07-05 NOTE — Patient Instructions (Addendum)
It was great meeting you! I am sorry that you are having so much white discharge. I got a swab during the appointment, I will call you on Monday with these results. I think it is also a good idea to get a vaginal ultrasound to better evaluate the firmness I felt on bimanual exam. You will have that done on 12/11 at 11:00 at [facility] [place]. I will call you with the results.  Fue un gran encuentro contigo! Lamento que est teniendo tanta descarga blanca. Tengo un hisopo durante la cita, te IT sales professional lunes con [employer] Creo que tambin es una buena idea hacerse una ecografa vaginal para evaluar mejor la firmeza que sent en el examen bimanual. Lo hars el 12/11 a las 11:00 en [facility] [place]. Te llamar con [employer]

## 2018-07-08 DIAGNOSIS — N898 Other specified noninflammatory disorders of vagina: Secondary | ICD-10-CM | POA: Insufficient documentation

## 2018-07-08 LAB — CERVICOVAGINAL ANCILLARY ONLY
Chlamydia: NEGATIVE
Neisseria Gonorrhea: NEGATIVE

## 2018-07-08 NOTE — Progress Notes (Signed)
   HPI 36 year old female who presents for 8 months of white vaginal discharge.  Patient seen on 09/12/2017 by Dr. Reesa Chew, STD testing was performed along with wet prep all which came back negative.  Patient states that the discharge is really not stopped since then.  Is not itchy, has not changed in color since then.  She is been having regular periods.  She has had no new sexual partners.  CC: white discharge   ROS:   Review of Systems See HPI for ROS.   CC, SH/smoking status, and VS noted  Objective: BP 112/60   Pulse 71   Temp 98.6 F (37 C) (Oral)   Ht 4' 10.5" (1.486 m)   Wt 167 lb 12.8 oz (76.1 kg)   SpO2 98%   BMI 34.47 kg/m  Gen: 37 year old latino female resting comfortably, no acute distress CV: RRR, no murmur Resp: CTAB, no wheezes, non-labored Abd: SNTND, BS present, no guarding or organomegaly GU: white discharge noted on speculum exam. Very firm, palpable nodules noted on bilateral ovaries and cervix.   Assessment and plan:  Vaginal discharge Will get std testing. Will perform wet prep. Given patient's abnormal finding on pelvic/bimanual exam will send for transvaginal ultrasound.   Orders Placed This Encounter  Procedures  . US Pelvic Complete With Transvaginal    Standing Status:   Future    Standing Expiration Date:   09/06/2019    Order Specific Question:   Reason for Exam (SYMPTOM  OR DIAGNOSIS REQUIRED)    Answer:   abnormal findings on palpation on bimanual exam    Order Specific Question:   Preferred imaging location?    Answer:   [facility])    No orders of the defined types were placed in this encounter.    Guadalupe Dawn MD PGY-2 Family Medicine Resident  07/08/2018 10:53 PM

## 2018-07-08 NOTE — Assessment & Plan Note (Signed)
Will get std testing. Will perform wet prep. Given patient's abnormal finding on pelvic/bimanual exam will send for transvaginal ultrasound.

## 2018-07-10 ENCOUNTER — Ambulatory Visit (HOSPITAL_COMMUNITY)
Admission: RE | Admit: 2018-07-10 | Discharge: 2018-07-10 | Disposition: A | Discharge status: Home / Self Care | Payer: Self-pay | Source: Ambulatory Visit | Attending: Family Medicine | Admitting: Family Medicine

## 2018-07-10 DIAGNOSIS — D259 Leiomyoma of uterus, unspecified: Secondary | ICD-10-CM | POA: Insufficient documentation

## 2018-07-10 DIAGNOSIS — N898 Other specified noninflammatory disorders of vagina: Secondary | ICD-10-CM | POA: Insufficient documentation

## 2018-08-30 ENCOUNTER — Telehealth: Payer: Self-pay | Admitting: Family Medicine

## 2018-08-30 NOTE — Telephone Encounter (Signed)
Unsure which labs she is referring to, seen last by Dr Kris Mouton on 12/9 for vaginal discharge and it looks like she had a yeast infection.  I will forward this to him, thanks!

## 2018-08-30 NOTE — Telephone Encounter (Signed)
Pt called and is requesting for the result of her test. Please call the pt back [phone]. ad

## 2018-09-03 NOTE — Telephone Encounter (Signed)
Family medicine telephone note  To recall patient using Spanish translator line.  Line rang for about 30 seconds and then stopped.  Patient has appointment with Dr. Reesa Chew in 10 days.  If she calls back we will attempt to get in touch with her, otherwise questions can be answered at that appointment.  Jodi Dawn MD PGY-2 Family Medicine Resident

## 2018-09-13 ENCOUNTER — Ambulatory Visit (INDEPENDENT_AMBULATORY_CARE_PROVIDER_SITE_OTHER): Payer: Self-pay | Admitting: Family Medicine

## 2018-09-13 VITALS — BP 100/60 | HR 60 | Temp 97.8°F | Wt 175.6 lb

## 2018-09-13 DIAGNOSIS — D259 Leiomyoma of uterus, unspecified: Secondary | ICD-10-CM | POA: Insufficient documentation

## 2018-09-13 DIAGNOSIS — B353 Tinea pedis: Secondary | ICD-10-CM

## 2018-09-13 DIAGNOSIS — Z712 Person consulting for explanation of examination or test findings: Secondary | ICD-10-CM

## 2018-09-13 MED ORDER — CLOTRIMAZOLE 1 % EX CREA: 1.0000 "application " | TOPICAL_CREAM | Freq: Two times a day (BID) | CUTANEOUS | 0 refills | Status: DC

## 2018-09-13 MED ORDER — FLUCONAZOLE 200 MG PO TABS: ORAL_TABLET | ORAL | 0 refills | Status: DC

## 2018-09-13 NOTE — Progress Notes (Signed)
   Subjective:   Patient ID: Jodi Kelly    DOB: 12-16-1980, 38 y.o. female   MRN: [id number]  CC: f/u discuss results   HPI: Jodi Kelly is a 38 y.o. female who presents to clinic today for the following issue. Patient is Spanish-speaking, [employer] interpreter used for this encounter, Trudee Grip ID# [id number]  Vaginal discharge STD testing and wet prep performed on 07/05/18 when seen by Dr. Kris Mouton, however patient states she did not hear back about results. Also at that time noted to have very firm palpable nodules on bilateral ovaries and cervix, had a pelvic ultrasound performed on 07/10/2018.  She reports she is still having vaginal discharge that is thick and white. Endorses vaginal itching and irritation.  No burning with urination or frequency/urgency.  No fever, chills, nausea, vomiting.  LMP 08/03/2018.  No abdominal or pelvic pain at this time.   ROS: See HPI for pertinent ROS.  Social: pt is a never smoker.   Medications reviewed. Objective:   BP 100/60   Pulse 60   Temp 97.8 F (36.6 C) (Oral)   Wt 175 lb 9.6 oz (79.7 kg)   SpO2 98%   BMI 36.08 kg/m  Vitals and nursing note reviewed.  General: 38 year old female, NAD Neck: supple  CV: RRR no MRG  Lungs: CTAB, normal effort  Abdomen: soft, NTND, +bs  Skin: warm, dry, bilateral flaking skin between great and second toes 2/2 fungal infection  Extremities: warm and well perfused   Neuro: alert, oriented x3, no focal deficits   Assessment & Plan:   Vaginal discharge Discussed negative gonorrhea/chlamydia.  Positive for yeast on wet prep - she was not treated for this.  -Rx: Diflucan, 2 tabs   Leiomyoma uteri Discussed pelvic US on 2/11 showed multiple small uterine leiomyomata but otherwise normal appearing endometrial complex, ovaries and adnexa.   She expresses good understanding of this.  Denies abdominal complaints today but will follow up if needed.     Tinea pedis Tinea pedis noted bilaterally between great  and second toes.  -Rx: clotrimazole 1% cream  Meds ordered this encounter  Medications  . fluconazole (DIFLUCAN) 200 MG tablet    Sig: Take 1 tablet today followed by second tablet in 72 hours if symptoms persist.    Dispense:  2 tablet    Refill:  0  . clotrimazole (LOTRIMIN) 1 % cream    Sig: Apply 1 application topically 2 (two) times daily.    Dispense:  30 g    Refill:  0   Lovenia Kim, MD [facility] PGY-3

## 2018-09-13 NOTE — Patient Instructions (Signed)
It was nice seeing you in today.  We discussed your results from your STD testing and wet prep at your last visit which showed a yeast infection.  I have sent in 2 tablets of Diflucan to your pharmacy.  You can take 1 tablet today followed by a second one in 72 hours if your symptoms persist.  Additionally, we discussed your ultrasound results which showed small uterine leiomyomata or fibroids.  The pelvic ultrasound was otherwise normal.  Please call clinic if you have any questions.

## 2018-09-13 NOTE — Assessment & Plan Note (Signed)
Tinea pedis noted bilaterally between great and second toes.  -Rx: clotrimazole 1% cream

## 2018-09-13 NOTE — Assessment & Plan Note (Signed)
Discussed pelvic US on 2/11 showed multiple small uterine leiomyomata but otherwise normal appearing endometrial complex, ovaries and adnexa.   She expresses good understanding of this.  Denies abdominal complaints today but will follow up if needed.

## 2019-09-01 ENCOUNTER — Ambulatory Visit: Payer: Self-pay | Admitting: Student in an Organized Health Care Education/Training Program

## 2020-01-13 ENCOUNTER — Encounter: Payer: Self-pay | Admitting: Student in an Organized Health Care Education/Training Program

## 2020-01-13 ENCOUNTER — Other Ambulatory Visit: Payer: Self-pay

## 2020-01-13 ENCOUNTER — Ambulatory Visit (INDEPENDENT_AMBULATORY_CARE_PROVIDER_SITE_OTHER): Payer: Self-pay | Admitting: Student in an Organized Health Care Education/Training Program

## 2020-01-13 VITALS — BP 92/58 | HR 74 | Ht 59.0 in | Wt 162.0 lb

## 2020-01-13 DIAGNOSIS — R7303 Prediabetes: Secondary | ICD-10-CM

## 2020-01-13 DIAGNOSIS — Z9189 Other specified personal risk factors, not elsewhere classified: Secondary | ICD-10-CM

## 2020-01-13 DIAGNOSIS — R599 Enlarged lymph nodes, unspecified: Secondary | ICD-10-CM

## 2020-01-13 DIAGNOSIS — D1724 Benign lipomatous neoplasm of skin and subcutaneous tissue of left leg: Secondary | ICD-10-CM

## 2020-01-13 DIAGNOSIS — Z8632 Personal history of gestational diabetes: Secondary | ICD-10-CM | POA: Insufficient documentation

## 2020-01-13 LAB — POCT GLYCOSYLATED HEMOGLOBIN (HGB A1C): HbA1c, POC (controlled diabetic range): 5.8 % (ref 0.0–7.0)

## 2020-01-13 NOTE — Patient Instructions (Signed)
Fue un placer verte hoy! Para resumir nuestra discusin para esta visita: Su A1c volvi con normalidad. Continuaremos monitorendolo anualmente ya que tiene un alto riesgo de desarrollar diabetes en el futuro. Tiene un ganglio linftico inflamado en la clavcula que probablemente se deba a la inyeccin en ese brazo y es una reaccin muy normal de su sistema inmunolgico. Si no se hace ms pequeo y desaparece en un par de semanas, hgamelo saber. El crecimiento de su muslo es tejido Geneticist, molecular. Puedo enviarte a un cirujano para discutir cmo removerlo si lo deseas, pero no es un crecimiento peligroso.  It was a pleasure to see you today!  To summarize our discussion for this visit:  Your A1c came back as normal. We will continue to monitor it yearly since you are at high risk of developing diabetes in the future.   You have a swollen lymph node at your clavicle that is likely from your shot in that arm and is a very normal reaction of your immune system. If it does not get smaller and go away in a couple weeks, please let me know.  The growth on your thigh is fatty tissue. I can send you to a surgeon to discuss how to remove it if you wish but it is not a dangerous growth.  Some additional health maintenance measures we should update are: Health Maintenance Due  Topic Date Due  . Hepatitis C Screening  Never done  . COVID-19 Vaccine (1) Never done  . TETANUS/TDAP  11/13/2019  .   Please return to our clinic to see me in one year or sooner as needed.  Call the clinic at [phone] if your symptoms worsen or you have any concerns.   Thank you for allowing me to take part in your care,  Dr. Doristine Mango   Lipoma Lipoma  Un lipoma es un tumor no canceroso (benigno) formado por clulas de grasa. Es un tipo muy frecuente de [employer] tejidos blandos. Por lo general, los lipomas se encuentran debajo de la piel (subcutneos). Pueden aparecer en cualquier tejido del cuerpo que  contenga grasa. Las zonas en las que los lipomas aparecen con mayor frecuencia incluyen la espalda, los [place], los hombros, las nalgas y los muslos. Los lipomas crecen lentamente y, en general, son indoloros. La mayora de los lipomas no causan problemas y no requieren Clinical research associate. Cules son las causas? Se desconoce la causa de esta afeccin. Qu incrementa el riesgo? Es ms probable que sufra esta afeccin si:  Fidel Levy 44 y 41aos de edad.  Tiene antecedentes familiares de lipomas. Cules son los signos o sntomas? Por lo general, el lipoma aparece como una pequea protuberancia redonda debajo de la piel. En la [employer], el bulto tiene las siguientes caractersticas:  Se siente suave o elstico.  No causa dolor ni otros sntomas. Sin embargo, si el lipoma se encuentra en un rea en la que hace presin [employer] nervios, puede causar dolor u otros sntomas. Cmo se diagnostica? Por lo general, el lipoma puede diagnosticarse con un examen fsico. Tambin pueden hacerle estudios para confirmar el diagnstico y Paramedic otras afecciones. Las pruebas pueden incluir las siguientes:  Pruebas de diagnstico por imgenes, como una exploracin por tomografa computarizada (TC) o una resonancia magntica (RM).  Extraccin de [place] de tejido para analizar con un microscopio (biopsia). Cmo se trata? El tratamiento de esta afeccin depende del tamao del lipoma y si causa algn sntoma.  Los lipomas pequeos que  no causan problemas no requieren tratamiento.  Si un lipoma se agranda o causa problemas, puede realizarse [place] para extirpar el lipoma. Los lipomas tambin pueden extirparse para mejorar el aspecto. En la [employer], [place] procedimiento se realiza despus de aplicar un medicamento que adormece el rea (anestesia local).  Pueden realizarse una liposuccin para reducir el tamao del lipoma antes de que se lo extraigan quirrgicamente, o bien se la  puede realizar para extirpar el lipoma. Los lipomas se extirpan con este mtodo para limitar el tamao de la incisin y las cicatrices. Se introduce un tubo de liposuccin a travs de una pequea incisin en el lipoma y el contenido del lipoma se extrae a travs del tubo mediante succin. Siga estas indicaciones en casa:  Controle el lipoma para detectar cambios.  Concurra a todas las visitas de seguimiento como se lo haya indicado el mdico. Esto es importante. Comunquese con un mdico si:  El lipoma se agranda o se endurece.  El lipoma comienza a causarle dolor, se enrojece o se hincha cada vez ms. Estos podran ser signos de infeccin o de una afeccin ms grave. Solicite ayuda de inmediato si:  Siente hormigueo o adormecimiento en el rea cerca del lipoma. Esto podra indicar que el lipoma es lo que causa dao nervioso. Resumen  Un lipoma es un tumor no canceroso formado por clulas de grasa.  La mayora de los lipomas no causan problemas y no requieren Clinical research associate.  Si un lipoma se agranda o causa problemas, puede realizarse [place] para extirpar el lipoma.  Comunquese con un mdico si el lipoma se agranda o se endurece, o si empieza a dolor, se pone de color rojo o se hincha cada vez ms. El dolor, el enrojecimiento y la hinchazn podran ser signos de infeccin o de una afeccin ms grave. Esta informacin no tiene Marine scientist el consejo del mdico. Asegrese de hacerle al mdico cualquier pregunta que tenga. Document Revised: 05/13/2019 Document Reviewed: 05/13/2019 Elsevier Patient Education  [place].

## 2020-01-13 NOTE — Progress Notes (Signed)
    SUBJECTIVE:   CHIEF COMPLAINT / HPI: physical and "knots"   H/o gestational diabetes- 5.8 A1c today  Knots above clavicle and "private area"- received covid vaccine in right arm last week. Has noticed a bump above right clavicle since last night which was tender to palpation. Seems improved today but still noticeable.  Additionally- has knot on left thigh which has been present for several years and is growing bigger. Has not been painful, red, draining, or hot. Has a new similar but smaller bump on inner thigh which has been present about a year and has slowly grown over that time.   PERTINENT  PMH / PSH: gestational diabetes  OBJECTIVE:   BP (!) 92/58   Pulse 74   Ht 4\' 11"  (1.499 m)   Wt 162 lb (73.5 kg)   LMP 12/15/2019   SpO2 98%   BMI 32.72 kg/m   General: NAD, pleasant, able to participate in exam Cardiac: RRR, normal heart sounds, no murmurs. 2+ radial and PT pulses bilaterally Respiratory: CTAB, normal effort, No wheezes, rales or rhonchi Abdomen: soft, nontender, nondistended, no hepatic or splenomegaly, +BS Extremities: no edema or cyanosis. WWP. Skin: warm and dry, no rashes noted Neuro: alert and oriented x4, no focal deficits Psych: Normal affect and mood Lymph: soft, mobile, tender lymph node palpated supraclavicular right side <2cm  ASSESSMENT/PLAN:   History of gestational diabetes At risk for developing DM due to h/o GDM, hispanic, obesity,   Lymph node enlargement Likely physiological reaction to recent vaccine Asked patient to follow up if not improving in the next couple weeks  Lipoma Left lateral thigh- has been seen before but was much smaller and called a cyst at that time. Not causing pain and has continued to grow. There is another smaller lesion on inner thigh present for about a year and has been slowly growing- likely the same. Could attempt to remove in office or refer to general surgery. Patient has no insurance coverage so will give  her preference.  High risk for diabetes mellitus Obesity, hispanic, h/o gestational dm - high risk - A1c within normal limits today, recheck in 1 year - recommended patient attempt to lose weight     [place]

## 2020-01-13 NOTE — Assessment & Plan Note (Signed)
At risk for developing DM due to h/o GDM, hispanic, obesity,

## 2020-01-15 DIAGNOSIS — D179 Benign lipomatous neoplasm, unspecified: Secondary | ICD-10-CM | POA: Insufficient documentation

## 2020-01-15 DIAGNOSIS — R599 Enlarged lymph nodes, unspecified: Secondary | ICD-10-CM | POA: Insufficient documentation

## 2020-01-15 DIAGNOSIS — Z9189 Other specified personal risk factors, not elsewhere classified: Secondary | ICD-10-CM | POA: Insufficient documentation

## 2020-01-15 NOTE — Assessment & Plan Note (Signed)
Obesity, hispanic, h/o gestational dm - high risk - A1c within normal limits today, recheck in 1 year - recommended patient attempt to lose weight

## 2020-01-15 NOTE — Assessment & Plan Note (Signed)
Left lateral thigh- has been seen before but was much smaller and called a cyst at that time. Not causing pain and has continued to grow. There is another smaller lesion on inner thigh present for about a year and has been slowly growing- likely the same. Could attempt to remove in office or refer to general surgery. Patient has no insurance coverage so will give her preference.

## 2020-01-15 NOTE — Assessment & Plan Note (Signed)
Likely physiological reaction to recent vaccine Asked patient to follow up if not improving in the next couple weeks

## 2020-01-26 ENCOUNTER — Ambulatory Visit: Payer: Self-pay | Admitting: Student in an Organized Health Care Education/Training Program

## 2020-02-24 ENCOUNTER — Ambulatory Visit: Payer: Self-pay | Admitting: Student in an Organized Health Care Education/Training Program

## 2020-03-30 ENCOUNTER — Encounter (HOSPITAL_COMMUNITY): Payer: Self-pay | Admitting: Emergency Medicine

## 2020-03-30 ENCOUNTER — Emergency Department (HOSPITAL_COMMUNITY)
Admission: EM | Admit: 2020-03-30 | Discharge: 2020-03-30 | Disposition: A | Discharge status: Home / Self Care | Payer: Self-pay | Attending: Emergency Medicine | Admitting: Emergency Medicine

## 2020-03-30 DIAGNOSIS — F410 Panic disorder [episodic paroxysmal anxiety] without agoraphobia: Secondary | ICD-10-CM | POA: Insufficient documentation

## 2020-03-30 DIAGNOSIS — Z5321 Procedure and treatment not carried out due to patient leaving prior to being seen by health care provider: Secondary | ICD-10-CM | POA: Insufficient documentation

## 2020-03-30 NOTE — ED Triage Notes (Signed)
BIB EMS from work. Patient had panic attack after having argument with coworker. Patient was hyperventilating upon EMS arrival and had brief episode of LOC. Patient is A/OX4

## 2020-03-30 NOTE — ED Notes (Signed)
Pt states that she feels better and is going home with her family.

## 2020-05-05 ENCOUNTER — Encounter: Payer: Self-pay | Admitting: Family Medicine

## 2020-05-05 ENCOUNTER — Other Ambulatory Visit: Payer: Self-pay

## 2020-05-05 ENCOUNTER — Ambulatory Visit
Admission: RE | Admit: 2020-05-05 | Discharge: 2020-05-05 | Disposition: A | Discharge status: Home / Self Care | Payer: No Typology Code available for payment source | Source: Ambulatory Visit | Attending: Family Medicine | Admitting: Family Medicine

## 2020-05-05 ENCOUNTER — Ambulatory Visit (INDEPENDENT_AMBULATORY_CARE_PROVIDER_SITE_OTHER): Payer: No Typology Code available for payment source | Admitting: Family Medicine

## 2020-05-05 VITALS — BP 118/64 | HR 67 | Wt 163.0 lb

## 2020-05-05 DIAGNOSIS — M79674 Pain in right toe(s): Secondary | ICD-10-CM

## 2020-05-05 NOTE — Assessment & Plan Note (Signed)
S/p injury 22 days ago. Patient with continued pain since then. History and examination lead me to suspect possible fracture of 5th digit and potentially also the base of the 5th metatarsal. I have ordered x-rays of the foot and 5th digit to further evaluate. Discussed with patient that she can take Tylenol for the pain, will avoid NSAID at this time as it may impair healing if patient does have a fracture.

## 2020-05-05 NOTE — Patient Instructions (Signed)
Fue maravilloso verte hoy.  Hoy hablamos de:  He ordenado radiografas de su pie derecho. Vaya a cualquiera de las ubicaciones de la hoja de papel para hacer esto. Puede entrar durante sus horas sin cita previa. Te [employer]. Para el dolor, puede tomar 650 mg de Tylenol cada 4-6 horas. Esto se puede encontrar sin receta.  [place] por [facility].  Llame al [phone] si tiene alguna pregunta sobre la cita de [place].  Asegrese de programar un seguimiento en la recepcin antes de irse hoy.  Sharion Settler, DO PGY-1 Family Medicine    It was wonderful to see you today.  Please bring ALL of your medications with you to every visit.   Today we talked about:  I have ordered x-rays of your right foot. Please go to any of the locations of the sheet of paper to get this done. You can walk in during their hours without an appointment. I will let you know of the results. For pain, you can take 650mg  Tylenol every 4-6 hours. This can be found over the counter.    Thank you for choosing [facility].   Please call [phone] with any questions about today's appointment.  Please be sure to schedule follow up at the front  desk before you leave today.   Sharion Settler, DO PGY-1 Family Medicine

## 2020-05-05 NOTE — Progress Notes (Signed)
° ° °  SUBJECTIVE:   CHIEF COMPLAINT / HPI:   5th Digit Right Toe Pain Patient presents for continued pinky toe pain for the past 22 days s/p jamming her toe on the leg of a table. Patient states she did hear an audible crack when this happened. Since then, she has had difficulty walking around, often having to limp around. She noticed bruising and swelling after the event. The bruising has resolved but she still endorses some swelling around her ankle. She has not tried any medications to help with the pain. She has not tried ice but did try to put her foot in warm water with salt. Patient states that her pain went from a 10/10 when it initially occurred to a 9/10 presently. She describes the pain as throbbing. It is exacerbated with walking and when her foot accidentally slides across the ground if the floor is slippery. She also notes she can no longer curl her toes on her right foot downward, something she is able to do on her other foot. Patient denies previously injuring this foot in the past. Denies pain in her ankle or leg.   PMH / PSH:  Past Medical History:  Diagnosis Date   Cesarean delivery delivered 03/06/2011   Gestational diabetes 2012   OBJECTIVE:   BP 118/64    Pulse 67    Wt 163 lb (73.9 kg)    LMP 04/15/2020    SpO2 98%    BMI 32.92 kg/m   Physical Exam Constitutional:      Appearance: Normal appearance.     Comments: Mild distress  Musculoskeletal:     Comments: Right foot: tender to palpation along base of 5th metatarsal as well as throughout 5th phalanx, greater in middle and distal. +crepitus felt in distal phalanx. Limited ROM of 5th digit with abduction and adduction, worse with abduction. 2+ DP pulses b/l.   Neurological:     Mental Status: She is alert.      ASSESSMENT/PLAN:   Toe pain, right S/p injury 22 days ago. Patient with continued pain since then. History and examination lead me to suspect possible fracture of 5th digit and potentially also the base  of the 5th metatarsal. I have ordered x-rays of the foot and 5th digit to further evaluate. Discussed with patient that she can take Tylenol for the pain, will avoid NSAID at this time as it may impair healing if patient does have a fracture.     Sharion Settler, [place]

## 2020-05-21 ENCOUNTER — Ambulatory Visit (INDEPENDENT_AMBULATORY_CARE_PROVIDER_SITE_OTHER): Payer: Self-pay | Admitting: Student in an Organized Health Care Education/Training Program

## 2020-05-21 ENCOUNTER — Ambulatory Visit: Payer: Self-pay | Admitting: Student in an Organized Health Care Education/Training Program

## 2020-05-21 DIAGNOSIS — Z5329 Procedure and treatment not carried out because of patient's decision for other reasons: Secondary | ICD-10-CM

## 2020-05-21 NOTE — Progress Notes (Signed)
No show for annual physical

## 2020-07-19 ENCOUNTER — Encounter: Payer: Self-pay | Admitting: Student in an Organized Health Care Education/Training Program

## 2020-07-19 ENCOUNTER — Other Ambulatory Visit: Payer: Self-pay

## 2020-07-19 ENCOUNTER — Ambulatory Visit (INDEPENDENT_AMBULATORY_CARE_PROVIDER_SITE_OTHER): Payer: Self-pay | Admitting: Student in an Organized Health Care Education/Training Program

## 2020-07-19 DIAGNOSIS — R87610 Atypical squamous cells of undetermined significance on cytologic smear of cervix (ASC-US): Secondary | ICD-10-CM

## 2020-07-19 DIAGNOSIS — B353 Tinea pedis: Secondary | ICD-10-CM

## 2020-07-19 DIAGNOSIS — G5700 Lesion of sciatic nerve, unspecified lower limb: Secondary | ICD-10-CM | POA: Insufficient documentation

## 2020-07-19 DIAGNOSIS — G5702 Lesion of sciatic nerve, left lower limb: Secondary | ICD-10-CM

## 2020-07-19 MED ORDER — NAPROXEN 500 MG PO TABS
500.0000 mg | ORAL_TABLET | Freq: Two times a day (BID) | ORAL | 0 refills | Status: DC
Start: 2020-07-19 — End: 2020-12-13

## 2020-07-19 MED ORDER — CLOTRIMAZOLE 1 % EX CREA: 1.0000 "application " | TOPICAL_CREAM | Freq: Two times a day (BID) | CUTANEOUS | 0 refills | Status: DC

## 2020-07-19 NOTE — Assessment & Plan Note (Signed)
H/o abnormal pap with most recent on 2/19 positive for ASCUS and neg HPV, same as 2016.  No current symptoms.  Repeat was due at 1 year f/u. Will return for appointment for pap due to time constraints.

## 2020-07-19 NOTE — Progress Notes (Signed)
   SUBJECTIVE:   CHIEF COMPLAINT / HPI: foot pain, back pain  Back- pain began 2 months ago. Seems to be steady and worsening. Exacerbated buy use. Lower back left side and radiates down her left hip and thigh. Sometimes feels like burning. No numbness or tingling. She just has shooting pain. Denies injury.  Tinea pedis- has redness and itching. Sometimes scratches until it bleeds. Present in both feet. The clotrimazole has helped in the past presentation but she ran out of it. This is similar to previous presentation.  Pap smear- due for repeat. Last pap smear was positive for ASCUS, neg HPV  OBJECTIVE:   BP 98/62   Pulse 84   Wt 166 lb 9.6 oz (75.6 kg)   SpO2 98%   BMI 33.65 kg/m   General: NAD, pleasant, able to participate in exam Back: tenderness to palpation of left gluteus.  Neg straight leg raise bilaterally Positive piriformis testing. Negative tenderness of IT band or lateral hip. No skin changes. Extremities: no edema. WWP. Skin: warm and dry, no rashes noted Neuro: alert and oriented, no focal deficits Psych: Normal affect and mood  ASSESSMENT/PLAN:   Piriformis syndrome of left side Straight leg raise negative. Symptoms not consistent with radiculopathy. Negative red flag symptoms.  Likely piriformis syndrome with sciatic irritation.  Trial of: Rehab exercises with NSAIDs both by mouth and topical. Follow up in 4-6 weeks If not improved, consider steroid injection which was discussed with patient.    Tinea pedis Refilled clotrimazole.   Abnormal Pap smear of cervix H/o abnormal pap with most recent on 2/19 positive for ASCUS and neg HPV, same as 2016.  No current symptoms.  Repeat was due at 1 year f/u. Will return for appointment for pap due to time constraints.      [place]

## 2020-07-19 NOTE — Assessment & Plan Note (Signed)
Refilled clotrimazole

## 2020-07-19 NOTE — Patient Instructions (Signed)
It was a pleasure to see you today!  To summarize our discussion for this visit:  For your back/hip pain. We should try antiinflammatory medicine by mouth and topical. The topical is voltaren which you can get from the pharmacy. Please also do these exercises below and return in 4-6 weeks if not improved.   We will do your pap smear in February.   I have refilled your medication for your athletes foot.   Some additional health maintenance measures we should update are: Health Maintenance Due  Topic Date Due  . Hepatitis C Screening  Never done  . TETANUS/TDAP  11/13/2019  . INFLUENZA VACCINE  02/29/2020  . PAP SMEAR-Modifier  09/12/2020  .    Please return to our clinic to see me 4-6 weeks.  Call the clinic at [phone] if your symptoms worsen or you have any concerns.   Thank you for allowing me to take part in your care,  Dr. Doristine Mango   Rehabilitacin para el sndrome piriforme Piriformis Syndrome Rehab Pregunte al mdico qu ejercicios son seguros para usted. Haga los ejercicios exactamente como se lo haya indicado el mdico y gradelos como se lo hayan indicado. Es normal sentir un estiramiento, tironeo, opresin o Tree surgeon leve al hacer estos ejercicios. Detngase de inmediato si siente un dolor repentino o Printmaker. No comience a hacer estos ejercicios hasta que se lo indique el mdico. Ejercicios de elongacin y amplitud de movimiento Estos ejercicios calientan los msculos y las articulaciones, y mejoran el movimiento y la flexibilidad de la cadera y la pelvis. Los ejercicios tambin ayudan a Best boy, el adormecimiento y el hormigueo. Rotacin de la cadera Este es un ejercicio en el que se acuesta sobre la espalda y estira los msculos que rotan la cadera (rotadores de la cadera) para Training and development officer las nalgas. 1. Acustese boca arriba sobre una superficie firme. 2. Llvese la rodilla izquierda/derecha hacia el hombro del mismo lado con la mano  izquierda/derecha hasta que la rodilla apunte al techo. Con la otra mano, tmese el tobillo izquierdo/derecho. 3. Con la rodilla firme, lleve suavemente el tobillo izquierdo/derecho hacia el otro hombro hasta sentir el estiramiento en las nalgas. 4. Mantenga esta posicin durante __________ segundos. Repita __________ veces. Realice este ejercicio __________ veces al da. Extensor de la cadera Este es un ejercicio en el que se acuesta sobre la espalda y acerca la rodilla al pecho. 1. Acustese boca arriba sobre una superficie firme. Debe tener ambas piernas extendidas. 2. Llvese la rodilla izquierda/derecha al pecho. Mantenga la pierna en esta posicin; para ello, tmese de la parte posterior del muslo o de la parte delantera de la rodilla. 3. Mantenga esta posicin durante __________ segundos. 4. Vuelva lentamente a la posicin inicial. Repita __________ veces. Realice este ejercicio __________ veces al da. Ejercicios de fortalecimiento Estos ejercicios fortalecen la cadera y los muslos, y les otorgan resistencia. La resistencia es la capacidad de usar los msculos durante un tiempo prolongado, incluso despus de que se cansen. Elevaciones de la pierna extendida, recostado de lado Este ejercicio fortalece los msculos que rotan la pierna a la altura de la cadera y la alejan del cuerpo (abductores de la cadera). 1. Recustese de costado con la pierna izquierda/derecha arriba. Recustese de [employer] cabeza, los hombros, la rodilla y la cadera estn alineados. Flexione la rodilla de abajo para mantener el equilibrio. 2. Eleve la pierna de arriba unas 4 a 6pulgadas (10 a 15cm), con los dedos de los pies apuntando  hacia adelante. 3. Mantenga esta posicin durante __________ segundos. 4. Baje lentamente la pierna hasta la posicin inicial. 5. Deje que los msculos se relajen completamente despus de cada repeticin. Repita __________ veces. Realice este ejercicio __________ veces al  da. Abduccin y rotacin de la cadera A veces, esto se denomina ejercicio cuadrpedo (sobre las manos y las rodillas). [place] y las rodillas sobre una superficie firme y Insurance underwriter. Las manos deben estar justo debajo de los hombros, y las rodillas debajo de la cadera. 2. Andrey Cota la rodilla izquierda/derecha hacia el costado. Mantenga la rodilla flexionada. No gire el cuerpo. 3. Mantenga esta posicin durante __________ segundos. 4. Lentamente, baje la pierna. Repita __________ veces. Realice este ejercicio __________ veces al da. Elevaciones con pierna extendida, boca abajo Este ejercicio [employer] que extienden las caderas desde la parte anterior de la pelvis (extensores de cadera). 1. Acustese boca abajo en una cama o una superficie firme con una almohada debajo de las caderas. 2. Tense los msculos de las nalgas para levantar la pierna izquierda/derecha unas 4 a 6 pulgadas (10 a 15cm) de la cama. No deje que la espalda se arquee. 3. Mantenga esta posicin durante __________ segundos. 4. Baje lentamente la pierna hasta la posicin inicial. 5. Deje que los msculos se relajen completamente despus de cada repeticin. Repita __________ veces. Realice este ejercicio __________ veces al da. Esta informacin no tiene Marine scientist el consejo del mdico. Asegrese de hacerle al mdico cualquier pregunta que tenga. Document Revised: 07/11/2018 Document Reviewed: 07/11/2018 Elsevier Patient Education  2020 [employer].

## 2020-07-19 NOTE — Assessment & Plan Note (Addendum)
Straight leg raise negative. Symptoms not consistent with radiculopathy. Negative red flag symptoms.  Likely piriformis syndrome with sciatic irritation.  Trial of: Rehab exercises with NSAIDs both by mouth and topical. Follow up in 4-6 weeks If not improved, consider steroid injection which was discussed with patient.

## 2020-11-17 NOTE — Progress Notes (Deleted)
    SUBJECTIVE:   CHIEF COMPLAINT / HPI:   Ms. Gracey is a 40 yo who presents for her physical and pap smear.   Gyn: Last pap smear was positive for ASCUS, neg HPV  PERTINENT  PMH / PSH: ***  OBJECTIVE:   There were no vitals taken for this visit.  ***  ASSESSMENT/PLAN:   No problem-specific Assessment & Plan notes found for this encounter.   Well woman exam    Health maintenance  Tdap, pap   [place]   {    This will disappear when note is signed, click to select method of visit    :1}

## 2020-11-18 ENCOUNTER — Encounter: Payer: Self-pay | Admitting: Family Medicine

## 2020-12-13 ENCOUNTER — Encounter: Payer: Self-pay | Admitting: Family Medicine

## 2020-12-13 ENCOUNTER — Ambulatory Visit (INDEPENDENT_AMBULATORY_CARE_PROVIDER_SITE_OTHER): Payer: Self-pay | Admitting: Family Medicine

## 2020-12-13 ENCOUNTER — Other Ambulatory Visit: Payer: Self-pay

## 2020-12-13 VITALS — HR 72 | Ht 59.0 in | Wt 164.1 lb

## 2020-12-13 DIAGNOSIS — T7840XA Allergy, unspecified, initial encounter: Secondary | ICD-10-CM

## 2020-12-13 DIAGNOSIS — R0602 Shortness of breath: Secondary | ICD-10-CM

## 2020-12-13 DIAGNOSIS — R7303 Prediabetes: Secondary | ICD-10-CM

## 2020-12-13 MED ORDER — CETIRIZINE HCL 10 MG PO TABS: 10.0000 mg | ORAL_TABLET | Freq: Every day | ORAL | 11 refills | Status: DC

## 2020-12-13 NOTE — Assessment & Plan Note (Signed)
She does have some increased muscle tension in her thoracic back but I do not think that is contributing to her main complaint and shooting pain down her right leg.  I suspect is most likely related to piriformis syndrome as the seem to be an issue in the past.  She was provided exercises in both [place] and Spanish and we discussed the etiology and appropriate rehab of this problem.

## 2020-12-13 NOTE — Assessment & Plan Note (Signed)
History does sound consistent with either seasonal allergies or pet dander induced allergies.  She was encouraged to start with trying cetirizine daily for symptoms but if that does not help, we can add on a fluticasone nasal spray.

## 2020-12-13 NOTE — Patient Instructions (Signed)
Piriformis syndrome: I think that your low back pain and shooting pain down your leg is related to something called piriformis syndrome.  This is often due to to the tight muscles in your hips.  I have provided some exercises for you to do at home to help improve your symptoms.  Allergies: I am sorry to hear about your allergies.  I think the first step will be to try over-the-counter medicine to see if we can control your symptoms.  For now we can start with Zyrtec 10 mg daily.  I will send in a prescription for you but you can also buy this without a prescription.  Use this daily to help control your symptoms for the next 1-2 weeks.  If it is not helpful, we can add on another medication called Flonase.   Sndrome del piriforme: creo que Conservation officer, historic buildings lumbar y Conservation officer, historic buildings punzante que baja por la pierna est relacionado con algo llamado sndrome del piriforme. Esto a menudo se debe a la tensin de los msculos de las caderas. He proporcionado algunos ejercicios para que usted haga en casa para ayudar a mejorar sus sntomas.  Alergias: Lamento escuchar acerca de sus alergias. Creo que Software engineer paso ser probar medicamentos de venta libre para ver si podemos controlar sus sntomas. Por ahora podemos empezar con Zyrtec 10 mg diarios. Le enviar una receta, pero tambin puede comprarla sin receta. Use esto diariamente para ayudar a controlar sus sntomas durante las prximas 1-2 semanas. Si no ayuda, podemos agregar otro medicamento llamado Flonase.

## 2020-12-13 NOTE — Progress Notes (Signed)
    SUBJECTIVE:   CHIEF COMPLAINT / HPI:   Piriformis syndrome Ms. Trampe notes that she has been having some low back pain with shooting pain down her right leg for about the past 3 days.  She denies any trauma.  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  She has not been taking any pain medication to help this issue so far.  She reports that she has had sciatica in the past and it feels very similar to her past issue.  She does not carry anything on her back during her work.  She is not having any trouble with urination or bowel movements.  Allergies She suspects that she may have dog or cat allergies.  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  She has previously tried medication for this but would like to know what would be most helpful to help prevent or treat the symptoms.  PERTINENT  PMH / PSH: Previous history of piriformis syndrome  OBJECTIVE:   Pulse 72   Ht 4\' 11"  (1.499 m)   Wt 164 lb 2 oz (74.4 kg)   LMP 11/13/2020   SpO2 98%   BMI 33.15 kg/m    General: Alert and cooperative and appears to be in no acute distress Cardio: Normal S1 and S2, no S3 or S4. Rhythm is regular. No murmurs or rubs.   Pulm: Clear to auscultation bilaterally, no crackles, wheezing, or diminished breath sounds. Normal respiratory effort Abdomen: Bowel sounds normal. Abdomen soft and non-tender.  Back: No tenderness with percussion of the spinous processes.  Paraspinal tenderness in the thoracic area.  Some SI tenderness bilaterally.  Positive straight leg raise on the right.  Negative straight leg raise on the left. Neuro: Cranial nerves grossly intact  ASSESSMENT/PLAN:   Piriformis syndrome She does have some increased muscle tension in her thoracic back but I do not think that is contributing to her  main complaint and shooting pain down her right leg.  I suspect is most likely related to piriformis syndrome as the seem to be an issue in the past.  She was provided exercises in both [place] and Spanish and we discussed the etiology and appropriate rehab of this problem.  Allergies History does sound consistent with either seasonal allergies or pet dander induced allergies.  She was encouraged to start with trying cetirizine daily for symptoms but if that does not help, we can add on a fluticasone nasal spray.   Health maintenance  She was encouraged to follow-up with her PCP in the next 1-2 months  Matilde Haymaker, MD [place]

## 2021-03-29 NOTE — Progress Notes (Addendum)
 *  Spanish interpreter used during entire encounter [place] [phone]  SUBJECTIVE:   CHIEF COMPLAINT / HPI:   Ms. Zunker is a 40 yo F who presents for the following  Skin irritation  Purchased an adhesive strapless bra and used it Saturday. Created skin irritation and pain on left breast when sticky substance could not be removed. She is concerned it is infected. She has not put anything on it but attempted to make her own dressing. She denies currently breast-feeding, other skin issues, allergies, changes in soaps lotions and detergents.  She also denies fever.  PERTINENT  PMH / PSH: Seasonal allergies  OBJECTIVE:   BP 116/81   Pulse 71   Wt 161 lb 12.8 oz (73.4 kg)   SpO2 100%   BMI 32.68 kg/m   Physical Exam Vitals reviewed.  Constitutional:      General: She is not in acute distress.    Appearance: She is not ill-appearing.  Skin:    Findings: Wound present.     Comments: 3-4cm superficial wound located on left breast at about 5 o clock. When dressing removed there appeared to be mild sanguinous fluid expression. No obvious pus or odor.   Neurological:     Mental Status: She is alert.  Psychiatric:     Comments: Tearful with removing self-made dressing to visualize wound  Media Information Document Information  Photos  Left breast  03/30/2021 14:31  Attached To:  Office Visit on 03/30/21 with Autry-Lott, Naaman Plummer, DO   Source Information  Autry-Lott, Naaman Plummer, DO  [facility]     ASSESSMENT/PLAN:   Open wound of skin Used adhesive bra 5 days prior. Now without open wound on left breast. It is superficial and appears there may have been a scab that was removed due to adhesive but patient insist she did not have any skin issue prior to using the adhesive bra. Initially with cotton home dressing over wound. Removed dressing with saline and gently peeling cotton off of wound. This was significantly painful but tolerated well for the patient. I see no definite  signs of infection at this time. It also does not appear to be an allergic reaction to the adhesive. Discussed non-adhesive dressing with triple antibiotic ointment. Changing twice daily and leaving open to air for some time after showering. Follow up if no improvement in 1 week. Patient sent with materials for extra dressing changes.   Gerlene Fee, [place]

## 2021-03-30 ENCOUNTER — Other Ambulatory Visit: Payer: Self-pay

## 2021-03-30 ENCOUNTER — Encounter: Payer: Self-pay | Admitting: Family Medicine

## 2021-03-30 ENCOUNTER — Ambulatory Visit (INDEPENDENT_AMBULATORY_CARE_PROVIDER_SITE_OTHER): Payer: Self-pay | Admitting: Family Medicine

## 2021-03-30 VITALS — BP 116/81 | HR 71 | Wt 161.8 lb

## 2021-03-30 DIAGNOSIS — N644 Mastodynia: Secondary | ICD-10-CM

## 2021-03-30 DIAGNOSIS — T148XXA Other injury of unspecified body region, initial encounter: Secondary | ICD-10-CM

## 2021-03-30 DIAGNOSIS — R238 Other skin changes: Secondary | ICD-10-CM

## 2021-03-30 NOTE — Patient Instructions (Addendum)
Thank you for coming in today. It appears the adhesive bra disturbed your skin causing a sore to form. For this keep the air clean and you can do antibiotic ointment dressing changes twice daily. After the shower allow it to breathe and dry out. If it does not improve of the week please come back to be reassessed.   Gracias por venir hoy. Parece que el sostn [employer] alter su piel causando que se formara una llaga. Para esto, mantenga el aire limpio y puede cambiar el vendaje con pomada antibitica dos veces al da. Despus de la ducha, deja que respire y se seque. Si no mejora de la semana por favor vuelva para ser reevaluado.  Dr. Janus Molder

## 2021-07-21 NOTE — Patient Instructions (Incomplete)
Fue maravilloso verte hoy.  Por favor traiga TODOS sus medicamentos a cada visita.  Hoy hablamos de:  -Hicimos una prueba de Papanicolaou hoy. Dado que sus dos ltimas pruebas de Papanicolaou fueron anormales, le programaremos una colopscopia. Esto nos ayudar mejor a visualizar cualquier anormalidad y tomar biopsias si es necesario. -Estamos haciendo anlisis de laboratorio hoy para controlar su colesterol y Hydrographic surveyor diabetes y hepatitis C. Le [employer] por telfono o por carta.   -We did a Pap smear today. Since your last two Pap smears were abnormal, we are scheduling you for a colopscopy. This will better help [place] visualize any abnormalities and take biopsies if needed.  -We are doing lab work today to check your cholesterol and screen for diabetes and Hepatitis C. I will let you know the results by phone or letter.     Clayburn Pert por elegir Medicina familiar [place].  Llame al [phone] si tiene alguna pregunta sobre la cita de [place].  Asegrese de programar un seguimiento en la recepcin antes de irse hoy.  Sharion Settler, D.O. PGY-2 Medicina Familiar

## 2021-07-21 NOTE — Progress Notes (Deleted)
° ° °  SUBJECTIVE:   Chief compliant/HPI: annual examination  Jodi Kelly is a 40 y.o. who presents today for an annual exam.   History tabs reviewed and updated ***.   Review of systems form reviewed and notable for ***.   OBJECTIVE:   There were no vitals taken for this visit. *** General: Awake, alert, oriented, in no acute distress, pleasant and cooperative with examination HEENT: Normocephalic, atraumatic, nares patent, dentition is good, oropharynx without erythema or exudates, TM's clear bilaterally, no thyroid nodules palpated Cardio: RRR without murmur, 2+ radial, DP and PT pulses b/l Respiratory: CTAB without wheezing/rhonchi/rales Abdomen: Soft, non-tender to palpation of all quadrants, non-distended, no rebound/guarding, no organomegaly MSK: Able to move all extremities spontaneously, good muscle strength, no abnormalities Extremities: without edema or cyanosis Neuro: Speech is clear and intact, no focal deficits, no facial asymmetry, follows commands  Psych: Normal mood and affect   ASSESSMENT/PLAN:   No problem-specific Assessment & Plan notes found for this encounter.   Annual Examination  See AVS for age appropriate recommendations.   PHQ score ***, reviewed and discussed.  Blood pressure reviewed and at goal ***. Goal <130/80 Asked about intimate partner violence and resources given as appropriate  The patient currently uses *** for contraception. Folate recommended as appropriate, minimum of 400 mcg per day.   Considered the following items based upon USPSTF recommendations: Diabetes screening: ordered Screening for elevated cholesterol: ordered HIV testing:  previously ordered and negative Hepatitis C: ordered*** Hepatitis B: {discussed/ordered:14545} Syphilis if at high risk: {discussed/ordered:14545} GC/CT {GC/CT screening :23818} Reviewed risk factors for latent tuberculosis and {not indicated/requested/declined:14582} Reviewed risk factors for  osteoporosis. Using FRAX tool estimated risk of major osteoporotic fracture of  ***%, early screening {ordered not order:23822::"not ordered"}  Discussed family history, BRCA testing {not indicated/requested/declined:14582}. Tool used to risk stratify was ***.  Cervical cancer screening: due for Pap today, cytology + HPV ordered. Last Pap smear in 2019 showed ASC-US, HPV neg. Pap in 2016 showed ASC-H. Will need colpo.  Breast cancer screening: discussed potential benefits, risks including overdiagnosis and biopsy, elected to wait until age 5*** Colorectal cancer screening: not applicable given age.  if age 32 or over.   Follow up in 1 year for next physical. Will return sooner for colposcopy.    Sharion Settler, [place]

## 2021-07-22 ENCOUNTER — Encounter: Payer: Self-pay | Admitting: Family Medicine

## 2021-07-23 NOTE — Progress Notes (Addendum)
SUBJECTIVE:   Chief compliant/HPI: annual examination  Jodi Kelly is a 40 y.o. who presents today for an annual exam.  Concerns today: Has some back pain and feet are itchy. Previously was using clotrimazole which helped. She has run out.   Back pain: Started ~5-6 months ago. Takes Tylenol on occasion, this doesn't seem to help. Previously prescribed flexeril which helped a little. Works as a Scientist, product/process development. Carries a vacuum on her back. Movement hurts. Still able to work. No trauma. No previous back surgeries.  Works out at [employer]. Goes every day, about 1 hour. Eats a lot of traditional [place] food (tacos, tortillas, rice, beans, etc).   History tabs reviewed and updated.   Review of systems form reviewed and notable for itchy feet, epigastric pain, back pain.   OBJECTIVE:   BP 125/81    Pulse 78    Ht _0  (1.499 m)    Wt 165 lb (74.8 kg)    SpO2 99%    BMI 33.33 kg/m   General: Awake, alert, oriented, in no acute distress, pleasant and cooperative with examination HEENT: Normocephalic, atraumatic, nares patent, dentition is good, hypermelanosis on tongue, oropharynx without erythema or exudates, TM's clear bilaterally, no thyroid nodules palpated Cardio: RRR without murmur, 2+ radial, DP and PT pulses b/l Respiratory: CTAB without wheezing/rhonchi/rales Abdomen: Soft, non-tender to palpation of all quadrants, non-distended, no rebound/guarding, no organomegaly GU: Normal appearance of labia majora and minora, without lesions. Vagina tissue pink, moist, without lesions or abrasions. Cervix normal appearance, slightly friable, without discharge from os. There was a small  approximately 1x1cm skin tag to left inner thigh  MSK: Able to move all extremities spontaneously, good muscle strength, no abnormalities Skin: Warm and dry. Feet with tinea pedis bilaterally. Great toe is slightly thickened. Has small lipoma to left inner thigh, approximately 1x1cm, larger lipoma to left hip that  measures approximately 4x4cm. Extremities: without edema or cyanosis Neuro: Speech is clear and intact, no focal deficits, no facial asymmetry, follows commands  Psych: Normal mood and affect  GU: Exam chaperoned by Mercer Pod, CMA   Depression screen [facility] 2/9 07/27/2021 03/30/2021 12/13/2020  Decreased Interest 0 0 0  Down, Depressed, Hopeless 0 0 0  PHQ - 2 Score 0 0 0  Altered sleeping 0 0 0  Tired, decreased energy 1 0 3  Change in appetite 0 0 0  Feeling bad or failure about yourself  0 0 0  Trouble concentrating 2 0 0  Moving slowly or fidgety/restless 0 0 -  Suicidal thoughts 0 0 0  PHQ-9 Score 3 0 3  Difficult doing work/chores Not difficult at all - -      ASSESSMENT/PLAN:   Abnormal Pap smear of cervix Last Pap in 2019 showed ASC-US, HPV negative. Had previous abnormal Pap in 2016 which showed ASC-H.  -Pap smear today with HPV co-testing -Scheduled for colposcopy on 1/5  Tinea pedis Previously on clotrimazole with some relief. Has evidence of fungus in between all toes. Symptomatic with diffuse pruritis. Toe nails are mildly thickened.  -CMP today -Start oral terbinafine 250 mg x12 weeks   Prediabetes A1c 6 today, slightly increased from last year when it was 5.8.  -Discussed exercise and dietary recommendations -Handout given  -Congratulated patient on starting a work out regimen and encouraged her to continue  -She is amenable to starting Metformin today to help slow progression and possibly help with weight loss. Start Metformin XR 500 mg daily  Dyspepsia  Describes epigastric pain after eating. No significant weight loss. -Start Pepcid 20 mg BID -Discussed ways to help by reducing alcohol/coffee consumption, staying upright after eating, avoiding eating late at night, weight loss, etc  Skin tag Has approximately 4x4cm skin tag to left hip. Cosmetically bothers her and she would like this removed. Notes she has another around genital area. This one was  smaller and approximately 1x1cm. -Referral to gen surgery.  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 Wears a vacuum on her back as well- this is likely not helping. Pain seems muscular. No red flag symptoms.  -Conservative treatments: stretching, Ibuprofen as needed, heat   Annual Examination  See AVS for age appropriate recommendations.   PHQ score 3, reviewed and discussed.  Blood pressure reviewed and at goal. Goal <130/80 Asked about intimate partner violence and resources given as appropriate  The patient currently does not use contraception. She has a history of tubal ligation.   Considered the following items based upon USPSTF recommendations: Diabetes screening: ordered Screening for elevated cholesterol: ordered HIV testing:  previously ordered and negative Hepatitis C: ordered Hepatitis B: discussed Syphilis if at high risk: discussed GC/CT  ordered Reviewed risk factors for latent tuberculosis and not indicated Reviewed risk factors for osteoporosis. Using FRAX tool estimated risk of major osteoporotic fracture of  1.3%, early screening not ordered  Discussed family history, BRCA testing  indicated given strong family history of breast cancer in her sister who passed in her 58s and ovarian cancer in her aunt . Tool used to risk stratify was Pedigree Risk Assessment. Referral to genetics placed today.  Cervical cancer screening: due for Pap today, cytology + HPV ordered. Last Pap smear in 2019 showed ASC-US, HPV neg. Pap in 2016 showed ASC-H. Will need colpo.  Breast cancer screening:  referral to genetics placed today, may need MRI imaging of breasts. Colorectal cancer screening: not applicable given age.  if age 60 or over.   Follow up in 1 year for next physical. Will return sooner for colposcopy.   Sharion Settler, [place]

## 2021-07-23 NOTE — Patient Instructions (Addendum)
Fue maravilloso verte hoy.  Por favor traiga TODOS sus medicamentos a cada visita.  Hoy hablamos de:  -Su A1c fue 6 hoy. Eres prediabtico. Hacer ejercicio y mejorar su dieta puede evitar que progrese a la diabetes. A continuacin encontrar informacin sobre cmo puede mejorar su dieta. Sigue haciendo ejercicio! -Estamos comenzando la metformina para ayudar a Nurse, children's progresin a la diabetes y posiblemente ayudar con la prdida de [place]. -Estoy enviando un medicamento llamado Pepcid que puede ayudar con sus sntomas de reflujo. Tambin discutimos formas de ayudar a esto disminuyendo el alcohol / cafena, evitando comer tarde en la noche, sentndose derecho despus de comer, prdida de peso -Estamos haciendo pruebas hoy para Chief Technology Officer su colesterol y tambin para la deteccin de la hepatitis C, que se recomienda en todas las personas. -Estamos haciendo una prueba de Papanicolaou y deteccin de infecciones de transmisin sexual. Marin Comment estoy programando una cita en nuestra clnica de colposcopia debido a su historial de pruebas de Papanicolaou anormales en el pasado. -Sus pies tienen hongos. Estamos comenzando un medicamento oral para ayudar. Tome una tableta de terbinafina al da durante 12 semanas. Esto llevar tiempo para mejorar. -Es probable que su dolor de espalda sea muscular. Las almohadillas trmicas pueden ayudar. Puede tomar ibuprofeno segn sea necesario para ayudar con la inflamacin. Recomendara los estiramientos de espalda. -Estoy enviando una referencia a ciruga general para la eliminacin de su marca cutnea  Gracias por elegir Medicina familiar [place].  Llame al [phone] si tiene alguna pregunta sobre la cita de [place].  Asegrese de programar un seguimiento en la recepcin antes de irse hoy.  Sharion Settler, D.O. PGY-2 Medicina Familiar   Diabetes mellitus y nutricin, en adultos Diabetes Mellitus and Nutrition, Adult Si sufre de diabetes, o diabetes  mellitus, es muy importante tener hbitos alimenticios saludables debido a que sus niveles de Designer, television/film set sangre (glucosa) se ven afectados en gran medida por lo que come y bebe. Comer alimentos saludables en las cantidades correctas, aproximadamente a la misma hora todos los [place], [place] ayudar a: Chief Technology Officer su glucemia. Disminuir el riesgo de sufrir una enfermedad cardaca. Mejorar la presin arterial. Science writer o mantener un peso saludable. Qu puede afectar mi plan de alimentacin? Todas las personas que sufren de diabetes son diferentes y cada una tiene necesidades diferentes en cuanto a un plan de alimentacin. El mdico puede recomendarle que trabaje con un nutricionista para elaborar el mejor plan para usted. Su plan de alimentacin puede variar segn factores como: Las caloras que necesita. Los medicamentos que toma. Su peso. Sus niveles de glucemia, presin arterial y colesterol. Su nivel de [place]. Otras afecciones que tenga, como enfermedades cardacas o renales. Cmo me afectan los carbohidratos? Los carbohidratos, o hidratos de carbono, afectan su nivel de glucemia ms que cualquier otro tipo de alimento. La ingesta de carbohidratos aumenta la cantidad de [employer]. Es importante conocer la cantidad de carbohidratos que se pueden ingerir en cada comida sin correr Engineer, manufacturing. Esto es Psychologist, forensic. Su nutricionista puede ayudarlo a calcular la cantidad de carbohidratos que debe ingerir en cada comida y en cada refrigerio. Cmo me afecta el alcohol? El alcohol puede provocar una disminucin de la glucemia (hipoglucemia), especialmente si [place] insulina o toma determinados medicamentos por va oral para la diabetes. La hipoglucemia es una afeccin potencialmente mortal. Los sntomas de la hipoglucemia, como somnolencia, mareos y confusin, son similares a los sntomas de haber consumido demasiado alcohol. No beba alcohol si: Su mdico  le indica no  hacerlo. Est embarazada, puede estar embarazada o est tratando de [place]. Si bebe alcohol: Limite la cantidad que bebe a lo siguiente: De 0 a 1 medida por da para las mujeres. De 0 a 2 medidas por da para los hombres. Sepa cunta cantidad de alcohol hay en las bebidas que toma. En los [place], una medida equivale a una botella de cerveza de 12 oz (355 ml), un vaso de vino de 5 oz (148 ml) o un vaso de una bebida alcohlica de alta graduacin de 1 oz (44 ml). Mantngase hidratado bebiendo agua, refrescos dietticos o t helado sin azcar. Tenga en cuenta que los refrescos comunes, los jugos y otras bebidas para mezclar pueden contener Freight forwarder y se deben contar como carbohidratos. Consejos para seguir Photographer las etiquetas de los alimentos Comience por leer el tamao de la porcin en la etiqueta de Informacin nutricional de los alimentos envasados y las bebidas. La cantidad de caloras, carbohidratos, grasas y otros nutrientes detallados en la etiqueta se basan en una porcin del alimento. Muchos alimentos contienen ms de una porcin por envase. Verifique la cantidad total de gramos (g) de carbohidratos totales en una porcin. Verifique la cantidad de gramos de grasas saturadas y grasas trans en una porcin. Escoja alimentos que no contengan estas grasas o que su contenido de estas sea [place]. Verifique la cantidad de miligramos (mg) de sal (sodio) en una porcin. La State Farm de las personas deben limitar la ingesta de sodio total a menos de 2300 mg Honeywell. Siempre consulte la informacin nutricional de los alimentos etiquetados como con bajo contenido de [place] o sin grasa. Estos alimentos pueden tener un mayor contenido de Location manager agregada o carbohidratos refinados, y deben evitarse. Hable con su nutricionista para identificar sus objetivos diarios en cuanto a los nutrientes mencionados en la etiqueta. Al ir de compras Evite comprar alimentos procesados,  enlatados o precocidos. Estos alimentos tienden a Special educational needs teacher mayor cantidad de [place], sodio y azcar agregada. Compre en la zona exterior de la tienda de comestibles. Esta es la zona donde se encuentran con mayor frecuencia las frutas y las verduras frescas, los cereales a granel, las carnes frescas y los productos lcteos frescos. Al cocinar Use mtodos de coccin a baja temperatura, como hornear, en lugar de mtodos de coccin a alta temperatura, como frer en abundante aceite. Cocine con aceites saludables, como el aceite de [place], canola o [place]. Evite cocinar con manteca, crema o carnes con alto contenido de grasa. Planificacin de las comidas Coma las comidas y los refrigerios regularmente, preferentemente a la misma hora todos [place]. Evite pasar largos perodos de tiempo sin comer. Consuma alimentos ricos en fibra, como frutas frescas, verduras, frijoles y cereales integrales. Consuma entre 4 y 6 onzas (entre 112 y 168 g) de protenas magras por da, como carnes [place], pollo, pescado, huevos o tofu. Una onza (oz) (28 g) de protena magra equivale a: 1 onza (28 g) de carne, pollo o pescado. 1 huevo.  taza (62 g) de tofu. Coma algunos alimentos por da que contengan grasas saludables, como aguacates, frutos secos, semillas y pescado. Qu alimentos debo comer? Lambert Mody Bayas. Manzanas. Naranjas. Duraznos. Damascos. Ciruelas. Uvas. Mangos. Papayas. Granadas. Kiwi. Cerezas. Verduras Verduras de Boeing, que incluyen [place], [place], col rizada, acelga, hojas de berza, hojas de mostaza y repollo. Remolachas. Coliflor. Brcoli. Zanahorias. Judas verdes. Tomates. Pimientos. Cebollas. Pepinos. Coles de Bruselas. Granos Granos integrales, como panes, galletas, tortillas, cereales y pastas  de salvado o integrales. Avena sin azcar. Quinua. Arroz integral o salvaje. Carnes y otras protenas Frutos de mar. Carne de ave sin piel. Cortes magros de ave y carne de res. Tofu. Frutos secos.  Semillas. Lcteos Productos lcteos sin grasa o con bajo contenido de [place], [place], yogur y [place]. Es posible que los productos detallados arriba no constituyan una lista completa de los alimentos y las bebidas que puede tomar. Consulte a un nutricionista para obtener ms informacin. Qu alimentos debo evitar? Lambert Mody Frutas enlatadas al almbar. Verduras Verduras enlatadas. Verduras congeladas con mantequilla o salsa de crema. Granos Productos elaborados con [place] y [place], como panes, pastas, bocadillos y cereales. Evite todos los alimentos procesados. Carnes y otras protenas Cortes de carne con alto contenido de Lobbyist. Carne de ave con piel. Carnes empanizadas o fritas. Carne procesada. Evite las grasas saturadas. Lcteos Yogur, Morse enteros. Bebidas Bebidas azucaradas, como gaseosas o t helado. Es posible que los productos que se enumeran ms [place] no constituyan una lista completa de los alimentos y las bebidas que Nurse, adult. Consulte a un nutricionista para obtener ms informacin. Preguntas para hacerle al mdico Debo consultar con un especialista certificado en atencin y educacin sobre la diabetes? Es necesario que me rena con un nutricionista? A qu nmero puedo llamar si tengo preguntas? Cules son los mejores momentos para controlar la glucemia? Dnde encontrar ms informacin: American Diabetes Association (Asociacin Estadounidense de la Diabetes): diabetes.org Academy of Nutrition and Dietetics (Academia de Nutricin y Information systems manager): eatright.Unisys Corporation of Diabetes and Digestive and Kidney Diseases ([place] la Diabetes y Iraan y Renales): AmenCredit.is Association of Diabetes Care & Education Specialists (Asociacin de Especialistas en Atencin y Educacin sobre la Diabetes): diabeteseducator.org Resumen Es importante tener hbitos alimenticios saludables debido a que sus niveles de  Designer, television/film set sangre (glucosa) se ven afectados en gran medida por lo que come y bebe. Es importante consumir alcohol con prudencia. Un plan de comidas saludable lo ayudar a controlar la glucosa en sangre y a reducir el riesgo de enfermedades cardacas. El mdico puede recomendarle que trabaje con un nutricionista para elaborar el mejor plan para usted. Esta informacin no tiene Marine scientist el consejo del mdico. Asegrese de hacerle al mdico cualquier pregunta que tenga. Document Revised: 03/24/2020 Document Reviewed: 03/24/2020 Elsevier Patient Education  2022 [employer].

## 2021-07-27 ENCOUNTER — Encounter: Payer: Self-pay | Admitting: Family Medicine

## 2021-07-27 ENCOUNTER — Ambulatory Visit (INDEPENDENT_AMBULATORY_CARE_PROVIDER_SITE_OTHER): Payer: Self-pay | Admitting: Family Medicine

## 2021-07-27 ENCOUNTER — Other Ambulatory Visit: Payer: Self-pay

## 2021-07-27 ENCOUNTER — Other Ambulatory Visit (HOSPITAL_COMMUNITY)
Admission: RE | Admit: 2021-07-27 | Discharge: 2021-07-27 | Disposition: A | Discharge status: Home / Self Care | Payer: Self-pay | Source: Ambulatory Visit | Attending: Family Medicine | Admitting: Family Medicine

## 2021-07-27 VITALS — BP 125/81 | HR 78 | Ht 59.0 in | Wt 165.0 lb

## 2021-07-27 DIAGNOSIS — Z13220 Encounter for screening for lipoid disorders: Secondary | ICD-10-CM

## 2021-07-27 DIAGNOSIS — Z1159 Encounter for screening for other viral diseases: Secondary | ICD-10-CM

## 2021-07-27 DIAGNOSIS — R7303 Prediabetes: Secondary | ICD-10-CM

## 2021-07-27 DIAGNOSIS — G8929 Other chronic pain: Secondary | ICD-10-CM

## 2021-07-27 DIAGNOSIS — Z113 Encounter for screening for infections with a predominantly sexual mode of transmission: Secondary | ICD-10-CM

## 2021-07-27 DIAGNOSIS — Z0000 Encounter for general adult medical examination without abnormal findings: Secondary | ICD-10-CM

## 2021-07-27 DIAGNOSIS — L918 Other hypertrophic disorders of the skin: Secondary | ICD-10-CM

## 2021-07-27 DIAGNOSIS — Z124 Encounter for screening for malignant neoplasm of cervix: Secondary | ICD-10-CM

## 2021-07-27 DIAGNOSIS — Z803 Family history of malignant neoplasm of breast: Secondary | ICD-10-CM

## 2021-07-27 DIAGNOSIS — Z8041 Family history of malignant neoplasm of ovary: Secondary | ICD-10-CM

## 2021-07-27 DIAGNOSIS — M549 Dorsalgia, unspecified: Secondary | ICD-10-CM | POA: Insufficient documentation

## 2021-07-27 DIAGNOSIS — B353 Tinea pedis: Secondary | ICD-10-CM

## 2021-07-27 DIAGNOSIS — R87610 Atypical squamous cells of undetermined significance on cytologic smear of cervix (ASC-US): Secondary | ICD-10-CM

## 2021-07-27 DIAGNOSIS — B351 Tinea unguium: Secondary | ICD-10-CM

## 2021-07-27 DIAGNOSIS — R1013 Epigastric pain: Secondary | ICD-10-CM

## 2021-07-27 LAB — POCT GLYCOSYLATED HEMOGLOBIN (HGB A1C): HbA1c, POC (prediabetic range): 6 % (ref 5.7–6.4)

## 2021-07-27 MED ORDER — METFORMIN HCL ER 500 MG PO TB24: 500.0000 mg | ORAL_TABLET | Freq: Every day | ORAL | 3 refills | Status: DC

## 2021-07-27 MED ORDER — FAMOTIDINE 20 MG PO TABS: 20.0000 mg | ORAL_TABLET | Freq: Two times a day (BID) | ORAL | 1 refills | Status: DC

## 2021-07-27 MED ORDER — TERBINAFINE HCL 250 MG PO TABS: 250.0000 mg | ORAL_TABLET | Freq: Every day | ORAL | 0 refills | Status: AC

## 2021-07-27 NOTE — Assessment & Plan Note (Addendum)
A1c 6 today, slightly increased from last year when it was 5.8.  -Discussed exercise and dietary recommendations -Handout given  -Congratulated patient on starting a work out regimen and encouraged her to continue  -She is amenable to starting Metformin today to help slow progression and possibly help with weight loss. Start Metformin XR 500 mg daily

## 2021-07-27 NOTE — Assessment & Plan Note (Signed)
Previously on clotrimazole with some relief. Has evidence of fungus in between all toes. Symptomatic with diffuse pruritis. Toe nails are mildly thickened.  -CMP today -Start oral terbinafine 250 mg x12 weeks

## 2021-07-27 NOTE — Assessment & Plan Note (Signed)
Last Pap in 2019 showed ASC-US, HPV negative. Had previous abnormal Pap in 2016 which showed ASC-H.  -Pap smear today with HPV co-testing -Scheduled for colposcopy on 1/5

## 2021-07-27 NOTE — Assessment & Plan Note (Addendum)
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 Wears a vacuum on her back as well- this is likely not helping. Pain seems muscular. No red flag symptoms.  -Conservative treatments: stretching, Ibuprofen as needed, heat

## 2021-07-27 NOTE — Assessment & Plan Note (Addendum)
Has approximately 4x4cm skin tag to left hip. Cosmetically bothers her and she would like this removed. Notes she has another around genital area. This one was smaller and approximately 1x1cm. -Referral to gen surgery.

## 2021-07-27 NOTE — Assessment & Plan Note (Signed)
Describes epigastric pain after eating. No significant weight loss. -Start Pepcid 20 mg BID -Discussed ways to help by reducing alcohol/coffee consumption, staying upright after eating, avoiding eating late at night, weight loss, etc

## 2021-07-28 ENCOUNTER — Encounter: Payer: Self-pay | Admitting: Family Medicine

## 2021-07-28 LAB — COMPREHENSIVE METABOLIC PANEL
ALT: 25 IU/L (ref 0–32)
AST: 28 IU/L (ref 0–40)
Albumin/Globulin Ratio: 1.4 (ref 1.2–2.2)
Albumin: 4.2 g/dL (ref 3.8–4.8)
Alkaline Phosphatase: 89 IU/L (ref 44–121)
BUN/Creatinine Ratio: 14 (ref 9–23)
BUN: 8 mg/dL (ref 6–24)
Bilirubin Total: 0.3 mg/dL (ref 0.0–1.2)
CO2: 22 mmol/L (ref 20–29)
Calcium: 9.3 mg/dL (ref 8.7–10.2)
Chloride: 104 mmol/L (ref 96–106)
Creatinine, Ser: 0.56 mg/dL — ABNORMAL LOW (ref 0.57–1.00)
Globulin, Total: 3.1 g/dL (ref 1.5–4.5)
Glucose: 92 mg/dL (ref 70–99)
Potassium: 5.2 mmol/L (ref 3.5–5.2)
Sodium: 139 mmol/L (ref 134–144)
Total Protein: 7.3 g/dL (ref 6.0–8.5)
eGFR: 117 mL/min/{1.73_m2} (ref >59)

## 2021-07-28 LAB — LIPID PANEL
Chol/HDL Ratio: 9 ratio — ABNORMAL HIGH (ref 0.0–4.4)
Cholesterol, Total: 217 mg/dL — ABNORMAL HIGH (ref 100–199)
HDL: 24 mg/dL — ABNORMAL LOW (ref >39)
LDL Chol Calc (NIH): 153 mg/dL — ABNORMAL HIGH (ref 0–99)
Triglycerides: 216 mg/dL — ABNORMAL HIGH (ref 0–149)
VLDL Cholesterol Cal: 40 mg/dL (ref 5–40)

## 2021-07-28 LAB — HCV INTERPRETATION

## 2021-07-28 LAB — HCV AB W REFLEX TO QUANT PCR: HCV Ab: <0.1 s/co ratio (ref 0.0–0.9)

## 2021-07-29 ENCOUNTER — Telehealth: Payer: Self-pay | Admitting: Family Medicine

## 2021-07-29 DIAGNOSIS — A749 Chlamydial infection, unspecified: Secondary | ICD-10-CM

## 2021-07-29 LAB — CERVICOVAGINAL ANCILLARY ONLY
Chlamydia: POSITIVE — AB
Comment: NEGATIVE
Comment: NEGATIVE
Comment: NORMAL
Neisseria Gonorrhea: NEGATIVE
Trichomonas: NEGATIVE

## 2021-07-29 NOTE — Telephone Encounter (Signed)
Attempted to call regarding results with Spanish interpreter x2 with no answer, voicemail was full.  We will try again later.

## 2021-08-03 MED ORDER — DOXYCYCLINE HYCLATE 100 MG PO TABS: 100.0000 mg | ORAL_TABLET | Freq: Two times a day (BID) | ORAL | 0 refills | Status: AC

## 2021-08-03 NOTE — Telephone Encounter (Signed)
Called patient to discuss positive chlamydia result.  We will treat with doxycycline.  She states her partner does not have a PCP so will fill out form for EPT.  Advised that he will need to pick up prescription at [place].  Advised to abstain from sexual activity for at least 7 days after she completes treatment and will need to retest in 2 to 3 months.  EPT form filled out and left in Dr. Margaretha Sheffield box who is the AM preceptor tomorrow.  She is aware of colposcopy appointment tomorrow.

## 2021-08-04 ENCOUNTER — Ambulatory Visit (INDEPENDENT_AMBULATORY_CARE_PROVIDER_SITE_OTHER): Payer: Self-pay | Admitting: Family Medicine

## 2021-08-04 ENCOUNTER — Other Ambulatory Visit: Payer: Self-pay

## 2021-08-04 VITALS — BP 122/72 | HR 82 | Wt 165.8 lb

## 2021-08-04 DIAGNOSIS — A749 Chlamydial infection, unspecified: Secondary | ICD-10-CM

## 2021-08-04 DIAGNOSIS — R87610 Atypical squamous cells of undetermined significance on cytologic smear of cervix (ASC-US): Secondary | ICD-10-CM

## 2021-08-04 LAB — CYTOLOGY - PAP
Comment: NEGATIVE
Diagnosis: UNDETERMINED — AB
High risk HPV: NEGATIVE

## 2021-08-04 NOTE — Progress Notes (Signed)
° ° °  SUBJECTIVE:   CHIEF COMPLAINT / HPI:   In-person interpreter: Leland Her  Patient presents to the colposcopy clinic as she was told her pap smears were abnormal and she may need further testing. She has no other concerns at this time and is overall feeling well.   PERTINENT  PMH / PSH: Reviewed  OBJECTIVE:   BP 122/72    Pulse 82    Wt 165 lb 12.8 oz (75.2 kg)    LMP 07/15/2021    SpO2 99%    BMI 33.49 kg/m   General: NAD, well-appearing, well-nourished Respiratory: No respiratory distress, breathing comfortably, able to speak in full sentences Skin: warm and dry, no rashes noted on exposed skin Psych: Appropriate affect and mood   ASSESSMENT/PLAN:   Abnormal Pap smear of cervix Recent pap smears by date as listed below: 07/27/21 ASC-US, HPV negative 09/12/17 - ASC-US, negative HPV 10/05/14 - ASC-H, negative HPV When using this data in the ASCCP guidelines, patient does not need a colposcopy at this time and instead will need a 1 year follow-up pap smear. This was discussed extensively with the patient, who voiced understanding. - Repeat Pap smear in 1 year (Dec 2023)   Chlamydia infection Patient tested positive for chlamydia on her well-woman exam on 07/27/2021 and was sent in a prescription. Patient was provided with EPT prescription in the clinic today and instructed to get it filled at the [facility].    Rise Patience, [place]

## 2021-08-04 NOTE — Assessment & Plan Note (Signed)
Recent pap smears by date as listed below: 07/27/21 ASC-US, HPV negative 09/12/17 - ASC-US, negative HPV 10/05/14 - ASC-H, negative HPV When using this data in the ASCCP guidelines, patient does not need a colposcopy at this time and instead will need a 1 year follow-up pap smear. This was discussed extensively with the patient, who voiced understanding. - Repeat Pap smear in 1 year (Dec 2023)

## 2021-08-04 NOTE — Telephone Encounter (Signed)
Completed and delivered to [place] clinic

## 2021-08-04 NOTE — Patient Instructions (Signed)
Su prueba de Papanicolaou fue un poco anormal, pero no necesitamos hacer ms pruebas hoy. Deber repetir la prueba de Papanicolaou en diciembre de 2023 para vigilar de cerca todo. Tambin se le entreg la receta para el tratamiento de su pareja, que obtendr en la farmacia para pacientes ambulatorios de [place] [phone], [place]). Debera poder recoger esta receta, pero si no, se lo harn saber.

## 2022-01-19 ENCOUNTER — Ambulatory Visit (INDEPENDENT_AMBULATORY_CARE_PROVIDER_SITE_OTHER): Payer: Self-pay | Admitting: Family Medicine

## 2022-01-19 VITALS — BP 100/62 | HR 74 | Ht 59.0 in | Wt 174.0 lb

## 2022-01-19 DIAGNOSIS — B351 Tinea unguium: Secondary | ICD-10-CM

## 2022-01-19 DIAGNOSIS — R7303 Prediabetes: Secondary | ICD-10-CM

## 2022-01-19 DIAGNOSIS — B353 Tinea pedis: Secondary | ICD-10-CM

## 2022-01-19 LAB — POCT GLYCOSYLATED HEMOGLOBIN (HGB A1C): HbA1c, POC (prediabetic range): 6.2 % (ref 5.7–6.4)

## 2022-01-19 MED ORDER — TERBINAFINE HCL 250 MG PO TABS: 250.0000 mg | ORAL_TABLET | Freq: Every day | ORAL | 0 refills | Status: AC

## 2022-01-19 NOTE — Progress Notes (Unsigned)
     SUBJECTIVE:   CHIEF COMPLAINT / HPI:   ***  PERTINENT  PMH / PSH: ***  OBJECTIVE:   BP 100/62   Pulse 74   Ht '4\' 11"'$  (1.499 m)   Wt 174 lb (78.9 kg)   LMP 12/13/2021   SpO2 97%   BMI 35.14 kg/m   ***  ASSESSMENT/PLAN:   No problem-specific Assessment & Plan notes found for this encounter.     Gerlene Fee, [place]

## 2022-01-19 NOTE — Patient Instructions (Addendum)
-  Recoger medicamentos en farmacia. Tomar diariamente durante 12 semanas.  -Su A1c fue 6.2, que es levemente elevado de 6. Tome su metformina segn lo prescrito.  -Pick up medication from pharmacy. Take daily for 12 weeks.  -Your A1c was 6.2 which is mildly elevated from 6.  Please take your metformin as prescribed.  Dr. Janus Molder

## 2022-01-20 LAB — COMPREHENSIVE METABOLIC PANEL
ALT: 20 IU/L (ref 0–32)
AST: 18 IU/L (ref 0–40)
Albumin/Globulin Ratio: 1.3 (ref 1.2–2.2)
Albumin: 4.3 g/dL (ref 3.8–4.8)
Alkaline Phosphatase: 92 IU/L (ref 44–121)
BUN/Creatinine Ratio: 11 (ref 9–23)
BUN: 8 mg/dL (ref 6–24)
Bilirubin Total: <0.2 mg/dL (ref 0.0–1.2)
CO2: 23 mmol/L (ref 20–29)
Calcium: 10.5 mg/dL — ABNORMAL HIGH (ref 8.7–10.2)
Chloride: 101 mmol/L (ref 96–106)
Creatinine, Ser: 0.72 mg/dL (ref 0.57–1.00)
Globulin, Total: 3.3 g/dL (ref 1.5–4.5)
Glucose: 120 mg/dL — ABNORMAL HIGH (ref 70–99)
Potassium: 4.9 mmol/L (ref 3.5–5.2)
Sodium: 137 mmol/L (ref 134–144)
Total Protein: 7.6 g/dL (ref 6.0–8.5)
eGFR: 106 mL/min/{1.73_m2} (ref >59)

## 2022-01-22 NOTE — Assessment & Plan Note (Signed)
A1c 6.2>6.0.  -Continue metformin

## 2022-01-24 ENCOUNTER — Encounter: Payer: Self-pay | Admitting: Family Medicine

## 2022-02-08 NOTE — Progress Notes (Signed)
    SUBJECTIVE:   CHIEF COMPLAINT / HPI:   Follow Up Tinea Pedis, Onychomycosis  Previously seen in June and another prescription for Terbinafine sent to pharmacy (didn't pick up Rx sent in December). This has been a chronic problem for her and she was previously on topical clotrimazole for this. Previous CMP without hepatic abnormalities. She feels her toes are slowly improving.  Elevated Calcium  Calcium level 10.5 in June. She received a letter that it was elevated. Would like it rechecked today. Denies any OTC supplements, vitamins, TUMS.   Heavy Menses Has heavy bleeding. Periods last at least 8 days. Changes pads about 4-5 pads a day. Has some uncomfortable cramps. Had Pelvic U/S in 2019 showing small leiomyomas. Would like referral to OBGYN.   PERTINENT  PMH / PSH:  Past Medical History:  Diagnosis Date   Cesarean delivery delivered 03/06/2011   Gestational diabetes 2012    OBJECTIVE:   BP 115/71   Pulse 72   Wt 173 lb 3.2 oz (78.6 kg)   LMP 12/13/2021   SpO2 99%   BMI 34.98 kg/m    General: NAD, pleasant, able to participate in exam Respiratory: Normal respiratory effort Feet: B/l toes painted. Some dry patchy areas on medial feet.  Psych: Normal affect and mood  ASSESSMENT/PLAN:   Hypercalcemia Slightly elevated on last CMP.  Not taking any over-the-counter supplements or vitamins.  Asymptomatic. -Recheck BMP today  Lipoma Previously referred to general surgery for removal.  Patient reports that they suggest that she go to dermatology.  Still like removal. -Ambulatory referral to dermatology placed today.  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  This is bothersome to patient.  She reports having heavy menses.  Like referral for management. -Referral to GYN placed today  Family history of breast cancer Has family history of breast and ovarian cancer.  Previous referral to genetics placed however they were unsuccessful at trying  to reach patient. -Referral to genetics placed again today.  Patient is aware they will try to call her for an appointment.  She request that they try to contact her work phone number.  Tinea pedis Has been on terbinafine for the last month.  Reports some improvement. -Continue terbinafine for 12-week period total    Sharion Settler, [place]

## 2022-02-15 ENCOUNTER — Encounter: Payer: Self-pay | Admitting: Family Medicine

## 2022-02-15 ENCOUNTER — Other Ambulatory Visit: Payer: Self-pay

## 2022-02-15 ENCOUNTER — Ambulatory Visit (INDEPENDENT_AMBULATORY_CARE_PROVIDER_SITE_OTHER): Payer: Self-pay | Admitting: Family Medicine

## 2022-02-15 VITALS — BP 115/71 | HR 72 | Wt 173.2 lb

## 2022-02-15 DIAGNOSIS — Z803 Family history of malignant neoplasm of breast: Secondary | ICD-10-CM

## 2022-02-15 DIAGNOSIS — L918 Other hypertrophic disorders of the skin: Secondary | ICD-10-CM

## 2022-02-15 DIAGNOSIS — Z8041 Family history of malignant neoplasm of ovary: Secondary | ICD-10-CM

## 2022-02-15 DIAGNOSIS — D259 Leiomyoma of uterus, unspecified: Secondary | ICD-10-CM

## 2022-02-15 DIAGNOSIS — B353 Tinea pedis: Secondary | ICD-10-CM

## 2022-02-15 DIAGNOSIS — D219 Benign neoplasm of connective and other soft tissue, unspecified: Secondary | ICD-10-CM

## 2022-02-15 DIAGNOSIS — D1724 Benign lipomatous neoplasm of skin and subcutaneous tissue of left leg: Secondary | ICD-10-CM

## 2022-02-15 MED ORDER — FAMOTIDINE 20 MG PO TABS
20.0000 mg | ORAL_TABLET | Freq: Two times a day (BID) | ORAL | 1 refills | Status: DC
Start: 2022-02-15 — End: 2022-06-07

## 2022-02-15 NOTE — Assessment & Plan Note (Signed)
Slightly elevated on last CMP.  Not taking any over-the-counter supplements or vitamins.  Asymptomatic. -Recheck BMP today

## 2022-02-15 NOTE — Assessment & Plan Note (Signed)
Has been on terbinafine for the last month.  Reports some improvement. -Continue terbinafine for 12-week period total

## 2022-02-15 NOTE — Assessment & Plan Note (Signed)
Previous ultrasound in 2019 showed multiple leiomyoma.  This is bothersome to patient.  She reports having heavy menses.  Like referral for management. -Referral to GYN placed today

## 2022-02-15 NOTE — Assessment & Plan Note (Signed)
Previously referred to general surgery for removal.  Patient reports that they suggest that she go to dermatology.  Still like removal. -Ambulatory referral to dermatology placed today.

## 2022-02-15 NOTE — Assessment & Plan Note (Signed)
Has family history of breast and ovarian cancer.  Previous referral to genetics placed however they were unsuccessful at trying to reach patient. -Referral to genetics placed again today.  Patient is aware they will try to call her for an appointment.  She request that they try to contact her work phone number.

## 2022-02-15 NOTE — Patient Instructions (Addendum)
Fue maravilloso verte hoy.  Por favor traiga TODOS sus medicamentos a cada visita.  Hoy hablamos de:  Estoy enviando una referencia a Dermatologa para la eliminacin de Therapist, art piel.  Estoy enviando una remisin a [place] debido a sus antecedentes familiares de cncer de mama y de ovario.  Estoy enviando una referencia a Ginecologa por sus perodos menstruales abundantes.  Estamos revisando su nivel de calcio hoy, lo contactar con los [place].  [place] Pert por elegir Medicina familiar [place].  Llame al [phone] si tiene alguna pregunta sobre la cita de [place].  Asegrese de programar un seguimiento en la recepcin antes de irse hoy.  Sharion Settler, D.O. PGY-3 Medicina Familiar

## 2022-02-16 ENCOUNTER — Telehealth: Payer: Self-pay | Admitting: Genetic Counselor

## 2022-02-16 LAB — BASIC METABOLIC PANEL
BUN/Creatinine Ratio: 11 (ref 9–23)
BUN: 7 mg/dL (ref 6–24)
CO2: 22 mmol/L (ref 20–29)
Calcium: 9.8 mg/dL (ref 8.7–10.2)
Chloride: 103 mmol/L (ref 96–106)
Creatinine, Ser: 0.65 mg/dL (ref 0.57–1.00)
Glucose: 101 mg/dL — ABNORMAL HIGH (ref 70–99)
Potassium: 4.4 mmol/L (ref 3.5–5.2)
Sodium: 139 mmol/L (ref 134–144)
eGFR: 112 mL/min/{1.73_m2} (ref >59)

## 2022-02-16 NOTE — Telephone Encounter (Signed)
Scheduled appt per 7/19 referral. Used interpreter services. Pt is aware of appt date and time. Pt is aware to arrive 15 mins prior to appt time and to bring and updated insurance card. Pt is aware of appt location.

## 2022-02-28 ENCOUNTER — Ambulatory Visit (INDEPENDENT_AMBULATORY_CARE_PROVIDER_SITE_OTHER): Payer: Self-pay | Admitting: Family Medicine

## 2022-02-28 VITALS — BP 118/74 | HR 76 | Wt 172.0 lb

## 2022-02-28 DIAGNOSIS — H5711 Ocular pain, right eye: Secondary | ICD-10-CM

## 2022-02-28 NOTE — Progress Notes (Signed)
    SUBJECTIVE:   CHIEF COMPLAINT / HPI:   Right eye complaint - was mopping with a heavy wooden handled mop last Saturday, hit the handle on her right temple - subsequently has experienced right temple swelling, tenderness, right eye redness and intermittent blurry vision - most concerned about eye - denies double vision, facial bruising, pain with EOM, difficulty hearing  PERTINENT  PMH / PSH: prediabetes, gestational diabetes, ASC-H on PAP 2016  OBJECTIVE:   BP 118/74   Pulse 76   Wt 172 lb (78 kg)   LMP 02/24/2022   SpO2 96%   BMI 34.74 kg/m    PHQ-9:     02/28/2022    4:13 PM 02/15/2022   10:49 AM 01/19/2022    2:20 PM  Depression screen PHQ 2/9  Decreased Interest 0 0 0  Down, Depressed, Hopeless 0 0 0  PHQ - 2 Score 0 0 0  Altered sleeping 0 0 0  Tired, decreased energy 0 1 0  Change in appetite 0 0 0  Feeling bad or failure about yourself  0 0 0  Trouble concentrating 0 0 0  Moving slowly or fidgety/restless 0 0 0  Suicidal thoughts 0 0 0  PHQ-9 Score 0 1 0    Physical Exam General: Awake, alert, oriented HEENT: PERRL, right TM pearly pink and flat, NO hemotympanum present, right external auditory canals with minimal cerumen burden, nasal mucosa slightly edematous, fluorescein test negative for abrasion, visual inspection negative for retained body or debris  ASSESSMENT/PLAN:   Pain of right eye Acute, onset Saturday after striking right temple with wooden mop handle. Highest concern for abrasion. Fluorescein test negative for corneal abrasion, visual inspection negative for debris/retained body. Recommended conservative measures with lubricating eye drops. Also recommended tylenol, ibuprofen, and ice for sore right temple.     Ezequiel Essex, MD [place]

## 2022-02-28 NOTE — Assessment & Plan Note (Signed)
Acute, onset Saturday after striking right temple with wooden mop handle. Highest concern for abrasion. Fluorescein test negative for corneal abrasion, visual inspection negative for debris/retained body. Recommended conservative measures with lubricating eye drops. Also recommended tylenol, ibuprofen, and ice for sore right temple.

## 2022-02-28 NOTE — Patient Instructions (Addendum)
I have translated the following text using Google translate.  As such, there are many errors.  I apologize for the poor written translation; however, we do not have written  translation services yet. It was wonderful to meet you today. Thank you for allowing me to be a part of your care. Below is a short summary of what we discussed at your visit today: He traducido el siguiente texto [employer] traductor de [employer]. Como tal, hay muchos errores. Pido disculpas por la mala traduccin escrita; sin embargo, an no contamos con servicios de traduccin escrita. Fue maravilloso conocerte hoy. Gracias por permitirme ser parte de su cuidado. A continuacin se muestra un breve resumen de lo que discutimos en su visita de hoy:  Eye pain The good news is that we did not find any scratches on your eye surface!  Use a lubricating eye drop to keep your eye happy.  Use ice, tylenol, and ibuprofen for your right sided headache Dolor de ojo La buena noticia es que no encontramos ningn rasguo en la superficie de sus ojos! Use una gota lubricante para los ojos para mantener su ojo Biomedical scientist. Use hielo, tylenol e ibuprofeno para su dolor de cabeza en el lado derecho  Health Maintenance We like to think about ways to keep you healthy for years to come. Below are some interventions and screenings we can offer to keep you healthy: - tetanus vaccine - COVID vaccine   Mantenimiento de la salud Nos gusta pensar en maneras de mantenerlo saludable en los aos venideros. A continuacin se presentan algunas intervenciones y exmenes que podemos ofrecer para mantenerlo saludable: - vacuna contra el ttanos - Vacuna para el COVID-19  Please bring all of your medications to every appointment! If you have any questions or concerns, please do not hesitate to contact us via phone or MyChart message.  Por favor traiga todos sus medicamentos a cada cita! Si tiene alguna pregunta o inquietud, no dude en comunicarse con nosotros por  telfono o mensaje de MyChart.  Ezequiel Essex, MD

## 2022-03-16 ENCOUNTER — Ambulatory Visit: Payer: Self-pay

## 2022-03-16 NOTE — Progress Notes (Deleted)
    SUBJECTIVE:   CHIEF COMPLAINT / HPI:  No chief complaint on file.   ***  PERTINENT  PMH / PSH: prediabetes  Patient Care Team: Sharion Settler, DO as PCP - General (Family Medicine)   OBJECTIVE:   LMP 02/24/2022   Physical Exam      02/28/2022    4:13 PM  Depression screen PHQ 2/9  Decreased Interest 0  Down, Depressed, Hopeless 0  PHQ - 2 Score 0  Altered sleeping 0  Tired, decreased energy 0  Change in appetite 0  Feeling bad or failure about yourself  0  Trouble concentrating 0  Moving slowly or fidgety/restless 0  Suicidal thoughts 0  PHQ-9 Score 0     {Show previous vital signs (optional):23777}  {Labs  Heme  Chem  Endocrine  Serology  Results Review (optional):23779}  ASSESSMENT/PLAN:   No problem-specific Assessment & Plan notes found for this encounter.    No follow-ups on file.   Zola Button, MD [place]

## 2022-04-06 ENCOUNTER — Inpatient Hospital Stay: Payer: Self-pay | Admitting: Genetic Counselor

## 2022-04-06 ENCOUNTER — Inpatient Hospital Stay: Payer: Self-pay

## 2022-04-06 ENCOUNTER — Other Ambulatory Visit: Payer: Self-pay

## 2022-04-10 ENCOUNTER — Telehealth: Payer: Self-pay | Admitting: Genetic Counselor

## 2022-04-10 NOTE — Telephone Encounter (Signed)
R/s pt's genetics appt per 9/8 staff msg. Used interpreter services. Pt is aware of new appt date/time.

## 2022-04-13 ENCOUNTER — Inpatient Hospital Stay: Payer: Self-pay | Attending: Genetic Counselor | Admitting: Genetic Counselor

## 2022-04-13 ENCOUNTER — Encounter: Payer: Self-pay | Admitting: Genetic Counselor

## 2022-04-13 ENCOUNTER — Other Ambulatory Visit: Payer: Self-pay

## 2022-04-13 ENCOUNTER — Inpatient Hospital Stay: Payer: Self-pay

## 2022-04-13 DIAGNOSIS — Z803 Family history of malignant neoplasm of breast: Secondary | ICD-10-CM

## 2022-04-13 DIAGNOSIS — Z8041 Family history of malignant neoplasm of ovary: Secondary | ICD-10-CM

## 2022-04-13 NOTE — Progress Notes (Signed)
REFERRING PROVIDER: Kinnie Feil, MD [address] [place],  [place] [address]  PRIMARY PROVIDER:  Sharion Settler, DO  PRIMARY REASON FOR VISIT:  Encounter Diagnoses  Name Primary?   Family history of breast cancer Yes   Family history of ovarian cancer     HISTORY OF PRESENT ILLNESS:   Ms. Kerce, a 41 y.o. female, was seen for a [facility] cancer genetics consultation at the request of Dr. Gwendlyn Deutscher due to a family history of cancer.  Ms. Coggin presents to clinic today to discuss the possibility of a hereditary predisposition to cancer, to discuss genetic testing, and to further clarify her future cancer risks, as well as potential cancer risks for family members.   Ms. Dahmer is a 41 y.o. female with no personal history of cancer.    RISK FACTORS:  Menarche was at age 61.  First live birth at age 20.  OCP use for approximately  <1  year.  Ovaries intact: yes.  Uterus intact: yes.  Menopausal status: premenopausal.  HRT use: 0 years. Colonoscopy: no Any excessive radiation exposure in the past: no  Past Medical History:  Diagnosis Date   Cesarean delivery delivered 03/06/2011   Gestational diabetes 2012    Past Surgical History:  Procedure Laterality Date   CESAREAN SECTION     TUBAL LIGATION  2012    Social History   Socioeconomic History   Marital status: Married    Spouse name: Not on file   Number of children: Not on file   Years of education: Not on file   Highest education level: Not on file  Occupational History   Not on file  Tobacco Use   Smoking status: Never    Passive exposure: Never   Smokeless tobacco: Never  Substance and Sexual Activity   Alcohol use: No   Drug use: No   Sexual activity: Yes    Partners: Male    Birth control/protection: Surgical    Comment: tubal ligation  Other Topics Concern   Not on file  Social History Narrative   Not on file   Social Determinants of Health   Financial Resource Strain: Not  on file  Food Insecurity: Not on file  Transportation Needs: Not on file  Physical Activity: Not on file  Stress: Not on file  Social Connections: Not on file     FAMILY HISTORY:  We obtained a detailed, 4-generation family history.  Significant diagnoses are listed below: Family History  Problem Relation Age of Onset   Diabetes Mother    Hypertension Father    Hyperlipidemia Father    Breast cancer Sister 18   Diabetes Brother    Ovarian cancer Paternal Aunt 75       Ms. Gullatt's sister was diagnosed with breast cancer at age 80 and died due to metastatic breast cancer at age 3. Her paternal aunt was diagnosed with ovarian cancer at age 36, she died at age 16. This paternal aunt's daughter (her cousin) was diagnosed with uterine cancer at an unknown age. Ms. Ertle is unaware of previous family history of genetic testing for hereditary cancer risks. There is no reported Ashkenazi Jewish ancestry.   GENETIC COUNSELING ASSESSMENT: Ms. Vialpando is a 41 y.o. female with a family history of cancer which is somewhat suggestive of a hereditary predisposition to cancer given her sister's young age at diagnosis. We, therefore, discussed and recommended the following at today's visit.   DISCUSSION: We discussed that 5 - 10% of  cancer is hereditary, with most cases of hereditary breast cancer associated with BRCA1/2.  There are other genes that can be associated with hereditary breast cancer syndromes.  We discussed that testing is beneficial for several reasons, including knowing about other cancer risks, identifying potential screening and risk-reduction options that may be appropriate, and to understanding if other family members could be at risk for cancer and allowing them to undergo genetic testing.  We reviewed the characteristics, features and inheritance patterns of hereditary cancer syndromes. We also discussed genetic testing, including the appropriate family members to test, the  process of testing, insurance coverage and turn-around-time for results. We discussed the implications of a negative, positive, carrier and/or variant of uncertain significant result. We discussed that negative results would be uninformative given that Ms. Cherry does not have a personal history of cancer. We recommended Ms. Bracknell pursue genetic testing for a panel that contains genes associated with breast, uterine, and ovarian cancer.  Ms. Rotz elected to have [place] [facility] Panel. The CancerNext gene panel offered by [employer] includes sequencing, rearrangement analysis, and RNA analysis for the following 36 genes:   APC, ATM, AXIN2, BARD1, BMPR1A, BRCA1, BRCA2, BRIP1, CDH1, CDK4, CDKN2A, CHEK2, DICER1, HOXB13, EPCAM, GREM1, MLH1, MSH2, MSH3, MSH6, MUTYH, NBN, NF1, NTHL1, PALB2, PMS2, POLD1, POLE, PTEN, RAD51C, RAD51D, RECQL, SMAD4, SMARCA4, STK11, and TP53.   Based on Ms. Brazel family history of cancer, she meets medical criteria for genetic testing. Despite that she meets criteria, she may still have an out of pocket cost. We discussed that if her out of pocket cost for testing is over $100, the laboratory will call and confirm whether she wants to proceed with testing.  If the out of pocket cost of testing is less than $100 she will be billed by the genetic testing laboratory.   A federal law called the Genetic Information Non-Discrimination Act (GINA) of 2008 helps protect individuals against genetic discrimination based on their genetic test results.  It impacts both health insurance and employment.  With health insurance, it protects against increased premiums, being kicked off insurance or being forced to take a test in order to be insured.  For employment it protects against hiring, firing and promoting decisions based on genetic test results.  GINA does not apply to those in the [employer], those who work for companies with less than 15 employees, and new life insurance or  long-term disability insurance policies.  Health status due to a cancer diagnosis is not protected under GINA.  PLAN: After considering the risks, benefits, and limitations, Ms. Jagielski provided informed consent to pursue genetic testing and the blood sample was sent to [facility] for analysis of the CancerNext Panel. Results should be available within approximately 2-3 weeks' time, at which point they will be disclosed by telephone to Ms. Furno, as will any additional recommendations warranted by these results. Ms. Leth will receive a summary of her genetic counseling visit and a copy of her results once available. This information will also be available in Epic.   Ms. Scherer questions were answered to her satisfaction today. Our contact information was provided should additional questions or concerns arise. Thank you for the referral and allowing [place] to share in the care of your patient.   Lucille Passy, MS, [facility] Genetic Counselor [email or web address] (P) [phone]  The patient was seen for a total of 35 minutes in face-to-face genetic counseling.  The patient brought her daughter. Drs. Lindi Adie and/or Burr Medico were available to discuss this case  as needed.  _______________________________________________________________________ For Office Staff:  Number of people involved in session: 2 Was an Intern/ student involved with case: no

## 2022-05-08 ENCOUNTER — Encounter: Payer: Self-pay | Admitting: Genetic Counselor

## 2022-05-08 ENCOUNTER — Telehealth: Payer: Self-pay | Admitting: Genetic Counselor

## 2022-05-08 DIAGNOSIS — Z1379 Encounter for other screening for genetic and chromosomal anomalies: Secondary | ICD-10-CM | POA: Insufficient documentation

## 2022-05-08 DIAGNOSIS — Z0000 Encounter for general adult medical examination without abnormal findings: Secondary | ICD-10-CM | POA: Insufficient documentation

## 2022-05-08 NOTE — Telephone Encounter (Signed)
I attempted to contact Ms. Roseman to discuss her genetic testing results. Her voicemail box has not been set up yet.   Lucille Passy, MS, [facility] Genetic Counselor [email or web address] (P) [phone]

## 2022-05-09 ENCOUNTER — Telehealth: Payer: Self-pay | Admitting: Genetic Counselor

## 2022-05-09 NOTE — Telephone Encounter (Signed)
I attempted to contact Jodi Kelly to discuss her genetic testing results. Her voicemail box has not been set up yet.    Lucille Passy, MS, [facility] Genetic Counselor [email or web address] (P) [phone]

## 2022-05-26 ENCOUNTER — Encounter: Payer: Self-pay | Admitting: Obstetrics and Gynecology

## 2022-06-05 NOTE — Telephone Encounter (Signed)
I contacted Ms. Bradeen to discuss her genetic testing results. No pathogenic variants were identified in the 36 genes analyzed. Detailed clinic note to follow.  The test report has been scanned into EPIC and is located under the Molecular Pathology section of the Results Review tab.  A portion of the result report is included below for reference.   Lucille Passy, MS, [facility] Genetic Counselor [email or web address] (P) [phone]

## 2022-06-07 ENCOUNTER — Ambulatory Visit (INDEPENDENT_AMBULATORY_CARE_PROVIDER_SITE_OTHER): Payer: Self-pay | Admitting: Family Medicine

## 2022-06-07 VITALS — BP 99/84 | HR 71 | Ht 59.0 in | Wt 178.0 lb

## 2022-06-07 DIAGNOSIS — B079 Viral wart, unspecified: Secondary | ICD-10-CM | POA: Insufficient documentation

## 2022-06-07 DIAGNOSIS — T148XXA Other injury of unspecified body region, initial encounter: Secondary | ICD-10-CM | POA: Insufficient documentation

## 2022-06-07 NOTE — Progress Notes (Signed)
    SUBJECTIVE:   CHIEF COMPLAINT / HPI:   Pain and bump on finger - on right finger. Started two week ago and getting bigger. 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 Never had anything like that before.   Hurts in upper back when breathing in- works in cleaning and on Wednesday 11/1 was mixing chemical and had a coughing fit and after that her upper left back hurt anytime she takes a deep breath in. No further coughing, no sputum production, no hemoptysis, no fevers or chills. She can point to left upper intercostal space that is tender. This is also sore with twisting and movements. Has tried tylenol which has helped some.  PERTINENT  PMH / PSH: pre-diabetes  OBJECTIVE:   BP 99/84   Pulse 71   Ht '4\' 11"'$  (1.499 m)   Wt 178 lb (80.7 kg)   LMP 05/25/2022   SpO2 100%   BMI 35.95 kg/m   General: A&O, NAD, speaking in complete sentences HEENT: No sign of trauma, EOM grossly intact Cardiac: RRR, no m/r/g, no chest wall TTP Respiratory: CTAB, normal WOB, no w/c/r GI: non-distended  Extremities: NTTP, no peripheral edema. On right pointer finger, 0.25 cm round raised lesion with callous c/w wart. No erythema or other lesions noted. No teleangectasia, no keratinization. MSK: left upper intercostal space TTP, no thoracic spine tenderness to palpation, no masses palpated Neuro: Normal gait, moves all four extremities appropriately. Psych: Appropriate mood and affect    ASSESSMENT/PLAN:   Viral wart on finger Consented patient for cryotherapy with liquid nitrogen, s/p 3 freeze/thaw cycles, patient tolerated well Discussed blister, care for afterwards, and signs of infection to watch for afterwards  Muscle strain Suspect due to coughing, lungs CTAB and no other systemic symptoms  TID ibuprofen with food, PRN apap Offered CXR but patient would like to start with conservative therapy Strict ED/return precautions fo coughing, fever, sputum, hemoptysis, chest  pain.     Lenoria Chime, MD [place]

## 2022-06-07 NOTE — Patient Instructions (Addendum)
It was wonderful to see you today.  Please bring ALL of your medications with you to every visit.   Today we talked about:  For your finger, You have a wart. We froze it today.  For your muscle strain, take ibuprofen 600 mg TID with food x1 week.  If fevers, chills, chest pain ,trouble breathing, sputum or coughing up blood, pelase return and go to ED.     Thank you for choosing [facility].   Please call [phone] with any questions about today's appointment.  Please make a follow up in 1 month as you are due for your pap smear.  Please arrive at least 15 minutes prior to your scheduled appointments.   If you had blood work today, I will send you a MyChart message or a letter if results are normal. Otherwise, I will give you a call.   If you had a referral placed, they will call you to set up an appointment. Please give [place] a call if you don't hear back in the next 2 weeks.   If you need additional refills before your next appointment, please call your pharmacy first.   Yehuda Savannah, MD  Family Medicine

## 2022-06-07 NOTE — Assessment & Plan Note (Signed)
Consented patient for cryotherapy with liquid nitrogen, s/p 3 freeze/thaw cycles, patient tolerated well Discussed blister, care for afterwards, and signs of infection to watch for afterwards

## 2022-06-07 NOTE — Assessment & Plan Note (Signed)
Suspect due to coughing, lungs CTAB and no other systemic symptoms  TID ibuprofen with food, PRN apap Offered CXR but patient would like to start with conservative therapy Strict ED/return precautions fo coughing, fever, sputum, hemoptysis, chest pain.

## 2022-06-08 ENCOUNTER — Encounter: Payer: Self-pay | Admitting: Genetic Counselor

## 2022-06-08 ENCOUNTER — Ambulatory Visit: Payer: Self-pay | Admitting: Genetic Counselor

## 2022-06-08 DIAGNOSIS — Z1379 Encounter for other screening for genetic and chromosomal anomalies: Secondary | ICD-10-CM

## 2022-06-08 NOTE — Progress Notes (Signed)
HPI:   Jodi Kelly was previously seen in the [place] clinic due to a family history of cancer and concerns regarding a hereditary predisposition to cancer. Please refer to our prior cancer genetics clinic note for more information regarding our discussion, assessment and recommendations, at the time. Jodi Kelly recent genetic Kelly results were disclosed to her, as were recommendations warranted by these results. These results and recommendations are discussed in more detail below.  CANCER HISTORY:  Oncology History   No history exists.    FAMILY HISTORY:  We obtained a detailed, 4-generation family history.  Significant diagnoses are listed below:      Family History  Problem Relation Age of Onset   Diabetes Mother     Hypertension Father     Hyperlipidemia Father     Breast cancer Sister 73   Diabetes Brother     Ovarian cancer Paternal Aunt 64           Jodi Kelly's sister was diagnosed with breast cancer at age 9 and died due to metastatic breast cancer at age 16. Her paternal aunt was diagnosed with ovarian cancer at age 65, she died at age 102. This paternal aunt's daughter (her cousin) was diagnosed with uterine cancer at an unknown age. Jodi Kelly is unaware of previous family history of genetic testing for hereditary cancer risks. There is no reported Ashkenazi Jewish ancestry.   GENETIC Kelly RESULTS:  The [facility] CancerNext Panel found no pathogenic mutations.  The CancerNext gene panel offered by [employer] includes sequencing, rearrangement analysis, and RNA analysis for the following 36 genes:   APC, ATM, AXIN2, BARD1, BMPR1A, BRCA1, BRCA2, BRIP1, CDH1, CDK4, CDKN2A, CHEK2, DICER1, HOXB13, EPCAM, GREM1, MLH1, MSH2, MSH3, MSH6, MUTYH, NBN, NF1, NTHL1, PALB2, PMS2, POLD1, POLE, PTEN, RAD51C, RAD51D, RECQL, SMAD4, SMARCA4, STK11, and TP53.   The Kelly report has been scanned into EPIC and is located under the Molecular Pathology section of the  Results Review tab.  A portion of the result report is included below for reference. Genetic testing reported out on 04/28/2022.       Even though a pathogenic variant was not identified, possible explanations for the cancer in the family may include: There may be no hereditary risk for cancer in the family. The cancers in her family may be due to other genetic or environmental factors. There may be a gene mutation in one of these genes that current testing methods cannot detect, but that chance is small. There could be another gene that has not yet been discovered, or that we have not yet tested, that is responsible for the cancer diagnoses in the family.  It is also possible there is a hereditary cause for the cancer in the family that Jodi Kelly did not inherit.  Therefore, it is important to remain in touch with cancer genetics in the future so that we can continue to offer Jodi Kelly the most up to date genetic testing.     ADDITIONAL GENETIC TESTING:  We discussed with Jodi Kelly that her genetic testing was fairly extensive.  If there are genes identified to increase cancer risk that can be analyzed in the future, we would be happy to discuss and coordinate this testing at that time.      CANCER SCREENING RECOMMENDATIONS:  Jodi Kelly result is considered negative (normal).  This means that we have not identified a hereditary cause for her family history of cancer at this time.   An  individual's cancer risk and medical management are not determined by genetic Kelly results alone. Overall cancer risk assessment incorporates additional factors, including personal medical history, family history, and any available genetic information that may result in a personalized plan for cancer prevention and surveillance. Therefore, it is recommended she continue to follow the cancer management and screening guidelines provided by her primary healthcare provider.  Based on the reported  personal and family history, specific cancer screenings for Jodi Kelly and her family include:  Breast Cancer Screening:  Multiple prediction models have been developed to assist with breast cancer risk prediction efforts for unaffected women without a known single gene risk factor identified in their family. The Tyrer-Cuzick model is one such risk assessment tool. This model was developed to include extensive family history information, endogenous estrogen exposure, and benign breast disease. The calculation is highly-dependent on the accuracy of clinical data provided by the patient. Other factors not accounted for in the calculation may impact lifetime breast cancer risk including, but not limited to, germline mutations not analyzed by the ordered genetic Kelly or clinical information not provided at the time of the testing. The risk number provided is patient-specific and cannot be used to infer risk to relatives.  Jodi Kelly risk score is 15.3%. This is above the general population risk of 13%, therefore, she is encouraged to continue to be mindful of her family history and be diligent with general population breast screening, including annual mammograms. She is encouraged to contact us regarding any changes to her personal or family history, as her recommendations for screening would be altered significantly if her lifetime risk is determined to be greater than 20% based on updated information.  RECOMMENDATIONS FOR FAMILY MEMBERS:   Since she did not inherit a mutation in a cancer predisposition gene included on this panel, her children could not have inherited a mutation from her in one of these genes. Individuals in this family might be at some increased risk of developing cancer, over the general population risk, due to the family history of cancer. We recommend women in this family have a yearly mammogram beginning at age 73, or 68 years younger than the earliest onset of  cancer, an annual clinical breast exam, and perform monthly breast self-exams.  FOLLOW-UP:  Cancer genetics is a rapidly advancing field and it is possible that new genetic tests will be appropriate for her and/or her family members in the future. We encouraged her to remain in contact with cancer genetics on an annual basis so we can update her personal and family histories and let her know of advances in cancer genetics that may benefit this family.   Our contact number was provided. Ms. Ballinger questions were answered to her satisfaction, and she knows she is welcome to call us at anytime with additional questions or concerns.   Lucille Passy, MS, [facility] Genetic Counselor [email or web address] (P) [phone]

## 2022-08-04 ENCOUNTER — Encounter: Payer: Self-pay | Admitting: Family Medicine

## 2022-08-04 ENCOUNTER — Ambulatory Visit (INDEPENDENT_AMBULATORY_CARE_PROVIDER_SITE_OTHER): Payer: Self-pay | Admitting: Family Medicine

## 2022-08-04 ENCOUNTER — Other Ambulatory Visit: Payer: Self-pay

## 2022-08-04 ENCOUNTER — Other Ambulatory Visit (HOSPITAL_COMMUNITY)
Admission: RE | Admit: 2022-08-04 | Discharge: 2022-08-04 | Disposition: A | Discharge status: Home / Self Care | Payer: Self-pay | Source: Ambulatory Visit | Attending: Family Medicine | Admitting: Family Medicine

## 2022-08-04 VITALS — BP 113/70 | HR 73 | Wt 177.2 lb

## 2022-08-04 DIAGNOSIS — R87610 Atypical squamous cells of undetermined significance on cytologic smear of cervix (ASC-US): Secondary | ICD-10-CM

## 2022-08-04 DIAGNOSIS — Z113 Encounter for screening for infections with a predominantly sexual mode of transmission: Secondary | ICD-10-CM | POA: Insufficient documentation

## 2022-08-04 DIAGNOSIS — D219 Benign neoplasm of connective and other soft tissue, unspecified: Secondary | ICD-10-CM

## 2022-08-04 DIAGNOSIS — Z0000 Encounter for general adult medical examination without abnormal findings: Secondary | ICD-10-CM

## 2022-08-04 DIAGNOSIS — Z13220 Encounter for screening for lipoid disorders: Secondary | ICD-10-CM

## 2022-08-04 DIAGNOSIS — L918 Other hypertrophic disorders of the skin: Secondary | ICD-10-CM

## 2022-08-04 LAB — POCT WET PREP (WET MOUNT)
Clue Cells Wet Prep Whiff POC: NEGATIVE
Trichomonas Wet Prep HPF POC: ABSENT

## 2022-08-04 NOTE — Progress Notes (Signed)
SUBJECTIVE:   Chief compliant/HPI: annual examination  Jodi Kelly is a 42 y.o. who presents today for an annual exam.  Current concerns: has some intermittent pain under her left breast.   Married, has four kids and a grandchild. Diet has been improving. She has not been working out as much.   She has finished terbinafine course, toenails are better than previous.  She started to take a weight loss supplement called "Evolution". She has been taking it for about 1 week.   History tabs reviewed and updated.   OBJECTIVE:   BP 113/70   Pulse 73   Wt 177 lb 3.2 oz (80.4 kg)   LMP 07/17/2022   SpO2 96%   BMI 35.79 kg/m   General: Awake, alert, oriented, in no acute distress, pleasant and cooperative with examination, well dressed and well groomed  HEENT: Normocephalic, atraumatic, nares patent, MMM Cardio: RRR without murmur Respiratory: CTAB without wheezing/rhonchi/rales Abdomen: Obese, soft, slight tenderness to palpation of epigastric quadrant without R/G, non-distended, no organomegaly GU: Normal appearance of labia majora and minora, without lesions. Vagina tissue pink, moist, without lesions or abrasions. Cervix normal appearance, friable, with clear discharge from os.  MSK: Able to move all extremities spontaneously, good muscle strength. Did have some ttp over palpation of medial left 6th and 7th ribs on palpation  Extremities: without edema or cyanosis Neuro: Speech is clear and intact, no focal deficits, no facial asymmetry, follows commands  Skin: Lateral left thigh has pedunculated skin tag with broad base (see picture below). Also has smaller (approximately 2x2 cm) skin tag to medial left thigh near genital area  Psych: Normal mood and affect  GU exam chaperoned by Delray Alt, CMA        08/04/2022    4:27 PM 06/07/2022    1:42 PM 02/28/2022    4:13 PM  Depression screen PHQ 2/9  Decreased Interest 0 0 0  Down, Depressed, Hopeless 0 0 0  PHQ - 2 Score  0 0 0  Altered sleeping 0 0 0  Tired, decreased energy 0 0 0  Change in appetite 0 0 0  Feeling bad or failure about yourself  0 0 0  Trouble concentrating 0 0 0  Moving slowly or fidgety/restless 0 0 0  Suicidal thoughts 0 0 0  PHQ-9 Score 0 0 0    ASSESSMENT/PLAN:   1. Routine screening for STI (sexually transmitted infection) Married and monogamous. Low risk but will check routinely.  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 She unfortunately did not show to her last GYN appointment. States this is because she had misplaced her phone which had the information for the appointment. She is still interested in evaluation to discuss options for this. - Ambulatory referral to Gynecology  4. Skin tag Previously referred to gen surgery for removal but they recommended Dermatology referral. Last referral placed in July, will place again. Patient given contact information to call office to schedule if she does not hear from them in 1-2 weeks. - Ambulatory referral to Dermatology  5. Atypical squamous cells of undetermined significance (ASCUS) on Papanicolaou smear of cervix ASC-US and negative HPV x3 Pap smears. She is overdue for repeat, have done today.     Annual Examination  See AVS for age appropriate recommendations.   PHQ score 0, reviewed and discussed.  Blood  pressure reviewed and at goal.  Asked about intimate partner violence and resources given as appropriate  Has tubal ligation for contraception.   Considered the following items based upon USPSTF recommendations: Diabetes screening:  not ordered as has been <1 year since check and not diabetic (she is prediabetic and on Metformin) Screening for elevated cholesterol: discussed and ordered HIV testing: discussed. Previously checked and negative.  Hepatitis C: discussed and previously  checked and negative Hepatitis B: discussed and ordered Syphilis if at high risk: discussed and ordered GC/CT  ordered Reviewed risk factors for latent tuberculosis and not indicated  Discussed family history, she has already had genetic evaluation for Fhx of cancer, she was found to not have any abnormal gene mutations.  Cervical cancer screening: due for Pap today, cytology + HPV ordered.  Previous Pap smears: 06/2021 with ASCUS, negative HPV. 09/12/17 - ASC-US, negative HPV. 10/05/14 - ASC-H, negative HPV Breast cancer screening: discussed potential benefits, risks including overdiagnosis and biopsy, elected to wait until age 21  Colorectal cancer screening: not applicable given age.  Recommend: influenza, COVID, and Tdap vaccines at HD given uninsured status.   Follow up in 1 year or sooner if indicated.    Sharion Settler, [place]

## 2022-08-04 NOTE — Patient Instructions (Addendum)
Fue maravilloso verte hoy.  Por favor traiga TODOS sus medicamentos a cada visita.  Hoy hablamos de:  Realizamos su prueba de Papanicolaou hoy junto con exmenes de rutina para detectar infecciones de transmisin sexual. Tambin estamos controlando tu colesterol. Te [employer]. Recomendara la vacuna contra la influenza, el COVID y el ttanos. Podrs recibirlos en el Departamento de [place], slo tendrs que llamarles para concertar una cita.     Puedes tomar ibuprofeno para el dolor. Si su dolor no mejora, podemos obtener imgenes.  Dermatology  Address: [address] [place], [place] [address] Phone: [phone]  Gracias por venir a su visita segn lo programado. ltimamente hemos tenido un gran problema de "ausencias" y esto limita significativamente nuestra capacidad para atender y Clinical biochemist a los pacientes. Como recordatorio amistoso: si no puede asistir a su cita, llame para cancelarla. Tenemos una poltica de no presentacin para aquellos que no cancelan dentro de las 24 horas. Nuestra poltica es que si falta o no cancela una cita dentro de las 24 horas, 3 veces en un perodo de 6 meses, es posible que lo despidan de [place].  Clayburn Pert por elegir Medicina familiar [place].  Llame al [phone] si tiene alguna pregunta sobre la cita de [place].  Asegrese de programar un seguimiento en la recepcin antes de irse hoy.  Sharion Settler, D.O. PGY-3 Medicina Familiar

## 2022-08-05 LAB — LIPID PANEL
Chol/HDL Ratio: 12.4 ratio — ABNORMAL HIGH (ref 0.0–4.4)
Cholesterol, Total: 186 mg/dL (ref 100–199)
HDL: 15 mg/dL — ABNORMAL LOW (ref >39)
LDL Chol Calc (NIH): 129 mg/dL — ABNORMAL HIGH (ref 0–99)
Triglycerides: 230 mg/dL — ABNORMAL HIGH (ref 0–149)
VLDL Cholesterol Cal: 42 mg/dL — ABNORMAL HIGH (ref 5–40)

## 2022-08-05 LAB — HEPATITIS B SURFACE ANTIGEN: Hepatitis B Surface Ag: NEGATIVE

## 2022-08-05 LAB — RPR: RPR Ser Ql: NONREACTIVE

## 2022-08-05 LAB — HIV ANTIBODY (ROUTINE TESTING W REFLEX): HIV Screen 4th Generation wRfx: NONREACTIVE

## 2022-08-06 ENCOUNTER — Encounter: Payer: Self-pay | Admitting: Family Medicine

## 2022-08-10 LAB — CYTOLOGY - PAP
Chlamydia: NEGATIVE
Comment: NEGATIVE
Comment: NEGATIVE
Comment: NORMAL
Diagnosis: NEGATIVE
Diagnosis: REACTIVE
High risk HPV: NEGATIVE
Neisseria Gonorrhea: NEGATIVE

## 2022-09-26 ENCOUNTER — Ambulatory Visit (INDEPENDENT_AMBULATORY_CARE_PROVIDER_SITE_OTHER): Payer: Self-pay | Admitting: Family Medicine

## 2022-09-26 ENCOUNTER — Encounter: Payer: Self-pay | Admitting: Family Medicine

## 2022-09-26 VITALS — BP 107/56 | HR 72 | Ht 59.0 in | Wt 175.0 lb

## 2022-09-26 DIAGNOSIS — B353 Tinea pedis: Secondary | ICD-10-CM

## 2022-09-26 DIAGNOSIS — M778 Other enthesopathies, not elsewhere classified: Secondary | ICD-10-CM

## 2022-09-26 DIAGNOSIS — E785 Hyperlipidemia, unspecified: Secondary | ICD-10-CM

## 2022-09-26 MED ORDER — ATORVASTATIN CALCIUM 20 MG PO TABS: 20.0000 mg | ORAL_TABLET | Freq: Every day | ORAL | 3 refills | Status: DC

## 2022-09-26 MED ORDER — CLOTRIMAZOLE 1 % EX CREA: 1.0000 | TOPICAL_CREAM | Freq: Two times a day (BID) | CUTANEOUS | 0 refills | Status: DC

## 2022-09-26 NOTE — Progress Notes (Signed)
    SUBJECTIVE:   CHIEF COMPLAINT / HPI:   Jodi Kelly is a 42 y.o. female who presents to the [facility] clinic today to discuss the following concerns:   Foot concerns Has history of athlete's foot and onychomycosis.  Previously treated with antifungals as well as 12 weeks of terbinafine with improvement.  States that her symptoms started again about 6 days ago.  She has itching between all of her toes, not affecting her toenails.  Right Elbow Pain Has had pain to her lateral right elbow.  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  Pain is worse with turning wrist and squeezing hand.  No other injuries.  PERTINENT  PMH / PSH: Onychomycosis   OBJECTIVE:   BP (!) 107/56   Pulse 72   Ht 4' 11"$  (1.499 m)   Wt 175 lb (79.4 kg)   LMP 09/15/2022   SpO2 99%   BMI 35.35 kg/m    General: NAD, pleasant, able to participate in exam Respiratory: normal effort Right Elbow: discomfort with turning wrist over lateral epicondyle. No edema or deformity. Normal strength and ROM. Normal sensation, 2+ radial pulse  Skin: warm and dry, tinea pedis appreciated between toes. No onychomycosis appreciated  Psych: Normal affect and mood  ASSESSMENT/PLAN:   1. Tinea pedis of both feet Toenails without evidence of onychomycosis.  She does have evidence of tinea pedis and between all toes.  Will trial topical antifungal for 4 weeks, if fails can trial oral medication.  - clotrimazole (LOTRIMIN) 1 % cream; Apply 1 Application topically 2 (two) times daily.  Dispense: 30 g; Refill: 0  2. Hyperlipidemia, unspecified hyperlipidemia type Lipid panel checked in January, ASCVD risk 4.5-6.3% and a moderate intensity statin is recommended. - atorvastatin (LIPITOR) 20 MG tablet; Take 1 tablet (20 mg total) by mouth daily.  Dispense: 90 tablet; Refill: 3  3. Tendonitis of Right Elbow Right-hand dominant. Worse with supination of wrist. Likely from overuse (cleans frequently). Discussed rest, ice, NSAID to help with  discomfort. Return if worsening or progressive symptoms.   Sharion Settler, [place]

## 2022-09-26 NOTE — Patient Instructions (Addendum)
Fue maravilloso verte hoy.  Por favor traiga TODOS sus medicamentos a cada visita.  Hoy hablamos de:  Le envo una crema para hongos a su farmacia. salo dos veces al da en tus pies durante 4 semanas. Si no mejora, podemos probar con un medicamento oral.  Tambin le he enviado un medicamento para el colesterol a su farmacia.  Gracias por venir a su visita segn lo programado. ltimamente hemos tenido un gran problema de "ausencias" y esto limita significativamente nuestra capacidad para atender y Clinical biochemist a los pacientes. Como recordatorio amistoso: si no puede asistir a su cita, llame para cancelarla. Tenemos una poltica de no presentacin para aquellos que no cancelan dentro de las 24 horas. Nuestra poltica es que si falta o no cancela una cita dentro de las 24 horas, 3 veces en un perodo de 6 meses, es posible que lo despidan de [place].  Clayburn Pert por elegir Medicina familiar [place].  Llame al [phone] si tiene alguna pregunta sobre la cita de [place].  Asegrese de programar un seguimiento en la recepcin antes de irse hoy.  Sharion Settler, D.O. PGY-3 Medicina Familiar

## 2023-02-19 NOTE — Progress Notes (Unsigned)
    SUBJECTIVE:   CHIEF COMPLAINT / HPI:   Tinea pedis/onychomycosis Reports worsening foot fungus over the past 1-2 months. Used the Lotrimin cream BID and it help a little bit but states when she ran out of the medication it came back. Itchy and feels like it is spreading over her left foot in particular.  Heavy periods Reports regular menstrual periods but states they are heavy. Can pass clots at times. H/o leiomyomas and referred to gyn but states she did not get the information. Discussed birth control options for bleeding control, patient wants to consider options further.   PERTINENT  PMH / PSH: Prediabetes  OBJECTIVE:   BP 114/75   Pulse 74   Ht 4\' 11"  (1.499 m)   Wt 170 lb 3.2 oz (77.2 kg)   LMP 02/17/2023   SpO2 98%   BMI 34.38 kg/m    General: Alert, no apparent distress, well groomed HEENT: Normocephalic, atraumatic, moist mucus membranes, neck supple Respiratory: Normal respiratory effort GI: Non-distended Skin: Dry cracking skin on dorsal surface of L foot. Dry, cracking skin in between toes on L foot. Some scaling and yellowish discoloration noted. Psych: Appropriate mood and affect  ASSESSMENT/PLAN:   Tinea pedis Skin improved with Lotrimin 1% cream but did not help onychomycosis. Worsening symptoms. Previous relief with oral Terbinafine. -Terbinafine 250mg  x 12 weeks -Baseline CMP and at 12 weeks.   Prediabetes A1c 6.2, compliant on Metformin 500mg  daily.  -Increase to Metformin XR 1000mg  daily -Repeat A1c in 6 months  Leiomyoma uteri Previously referred to [facility] for discussion, patient did not have information. Having heavy menstrual bleeding. Declined medication options to help with bleeding at this time and would like to consider options. -Provided with Gyn referral information -Provided Bedsider resource to look at hormonal options that can help with menstrual bleeding -CBC  Vision changes Reports reading up close can be blurry at  times. -Encouraged to schedule for yearly eye exam and consider reading glasses   Dr. Elberta Fortis, DO [facility]

## 2023-02-21 ENCOUNTER — Ambulatory Visit (INDEPENDENT_AMBULATORY_CARE_PROVIDER_SITE_OTHER): Payer: Self-pay | Admitting: Family Medicine

## 2023-02-21 ENCOUNTER — Encounter: Payer: Self-pay | Admitting: Family Medicine

## 2023-02-21 ENCOUNTER — Other Ambulatory Visit: Payer: Self-pay

## 2023-02-21 VITALS — BP 114/75 | HR 74 | Ht 59.0 in | Wt 170.2 lb

## 2023-02-21 DIAGNOSIS — E785 Hyperlipidemia, unspecified: Secondary | ICD-10-CM

## 2023-02-21 DIAGNOSIS — B353 Tinea pedis: Secondary | ICD-10-CM

## 2023-02-21 DIAGNOSIS — R7303 Prediabetes: Secondary | ICD-10-CM

## 2023-02-21 DIAGNOSIS — H539 Unspecified visual disturbance: Secondary | ICD-10-CM | POA: Insufficient documentation

## 2023-02-21 DIAGNOSIS — N920 Excessive and frequent menstruation with regular cycle: Secondary | ICD-10-CM

## 2023-02-21 DIAGNOSIS — D259 Leiomyoma of uterus, unspecified: Secondary | ICD-10-CM

## 2023-02-21 LAB — POCT GLYCOSYLATED HEMOGLOBIN (HGB A1C): HbA1c, POC (controlled diabetic range): 6.2 % (ref 0.0–7.0)

## 2023-02-21 MED ORDER — TERBINAFINE HCL 250 MG PO TABS: 250.0000 mg | ORAL_TABLET | Freq: Every day | ORAL | 0 refills | Status: DC

## 2023-02-21 MED ORDER — ATORVASTATIN CALCIUM 20 MG PO TABS: 20.0000 mg | ORAL_TABLET | Freq: Every day | ORAL | 3 refills | Status: DC

## 2023-02-21 MED ORDER — METFORMIN HCL ER 500 MG PO TB24: 1000.0000 mg | ORAL_TABLET | Freq: Every day | ORAL | 1 refills | Status: DC

## 2023-02-21 NOTE — Assessment & Plan Note (Signed)
Previously referred to [facility] for discussion, patient did not have information. Having heavy menstrual bleeding. Declined medication options to help with bleeding at this time and would like to consider options. -Provided with Gyn referral information -Provided Bedsider resource to look at hormonal options that can help with menstrual bleeding -CBC

## 2023-02-21 NOTE — Assessment & Plan Note (Signed)
Reports reading up close can be blurry at times. -Encouraged to schedule for yearly eye exam and consider reading glasses

## 2023-02-21 NOTE — Patient Instructions (Addendum)
  Fue maravilloso verte hoy! Karl Pock por elegir [facility].   Traiga TODOS sus medicamentos a cada visita.   Hoy hablamos de:  1. Tome [place] para los hongos en los pies durante las prximas 12 semanas. Quiero verte en octubre para comprobar tu funcin heptica y ver si el medicamento te funciona. 2. Puede visitar el sitio web de Bedsider para obtener ms informacin sobre las opciones para Chief Operating Officer su perodo. Consulte la informacin a continuacin sobre cmo llamar para programar una cita con el gineclogo. 3. Tome 1000 mg de metformina al da para su prediabetes. Volveremos a comprobar su A1c en 6 meses. 4. Si tiene dificultades para leer, puede acudir a Statistician o [employer] para hacerse un examen de la vista o probar gafas de lectura de venta libre para ver si le ayudan.  Gineclogo: [facility] para la atencin mdica de Architect for Women [employer]: [address], [place], [place] [address] Telfono: [phone]  Realice un seguimiento en 12 semanas para detectar hongos en los pies.  Si an no lo ha hecho, regstrese en My Chart para tener fcil acceso a los resultados de sus laboratorios y Engineer, materials con su mdico de Marine scientist.  Llame a la clnica al [phone] si sus sntomas empeoran o tiene alguna inquietud.  Asegrese de programar un seguimiento en la recepcin antes de irse hoy.   Douglass Rivers, DO Medicina Familiar    It was wonderful to see you today! Thank you for choosing [facility].   Please bring ALL of your medications with you to every visit.   Today we talked about:  Please take the Terbinafine for foot fungus for the next 12 weeks. I want to see you back in October to check your liver function and see if the medication work for you. You can the Bedsider website to get more information about options to control your period. Please see the information below about calling to schedule with the  Gynecologist. Please take Metformin 1000mg  daily for your prediabetes. We will recheck your A1c again in 6 months. For your difficulty reading, you can see [employer] or [employer] for eye exam or try over the counter reading glasses to see if they will help.  Gynecologist: [facility] Address: [address] [place], [place] [address] Phone: [phone]  Please follow up in 12 weeks for foot fungus  If you haven't already, sign up for My Chart to have easy access to your labs results, and communication with your primary care physician.  Call the clinic at [phone] if your symptoms worsen or you have any concerns.  Please be sure to schedule follow up at the front desk before you leave today.   Elberta Fortis, DO Family Medicine

## 2023-02-21 NOTE — Assessment & Plan Note (Addendum)
Skin improved with Lotrimin 1% cream but did not help onychomycosis. Worsening symptoms. Previous relief with oral Terbinafine. -Terbinafine 250mg  x 12 weeks -Baseline CMP and at 12 weeks.

## 2023-02-21 NOTE — Assessment & Plan Note (Signed)
A1c 6.2, compliant on Metformin 500mg  daily.  -Increase to Metformin XR 1000mg  daily -Repeat A1c in 6 months

## 2023-03-19 ENCOUNTER — Ambulatory Visit: Payer: Self-pay | Admitting: Family Medicine

## 2023-04-24 ENCOUNTER — Ambulatory Visit (INDEPENDENT_AMBULATORY_CARE_PROVIDER_SITE_OTHER): Payer: Self-pay | Admitting: Student

## 2023-04-24 VITALS — BP 127/81 | HR 80 | Ht 59.0 in | Wt 172.8 lb

## 2023-04-24 DIAGNOSIS — R519 Headache, unspecified: Secondary | ICD-10-CM

## 2023-04-24 DIAGNOSIS — R2232 Localized swelling, mass and lump, left upper limb: Secondary | ICD-10-CM

## 2023-04-24 MED ORDER — PREDNISONE 20 MG PO TABS
60.0000 mg | ORAL_TABLET | Freq: Every day | ORAL | 0 refills | Status: DC
Start: 2023-04-24 — End: 2023-04-26

## 2023-04-24 NOTE — Patient Instructions (Addendum)
It was wonderful to see you today. Thank you for allowing me to be a part of your care. Below is a short summary of what we discussed at your visit today:  Today I sent in prescription for 60 mg of steroid which you will take once daily for 5 days.  This is mostly due to concerns for your type of headache that you are currently having.  I have also collected lab for ESR to check for possible infection/inflammation causes of your headache.  I have sent in order for mammogram due to the lump you have in your left axilla area.  If you have any questions or concerns, please do not hesitate to contact us via phone or MyChart message.   Jerre Simon, MD [facility]    Fue maravilloso verte hoy. Gracias por permitirme ser parte de su cuidado. A continuacin se muestra un breve resumen de lo que discutimos en su visita de hoy:  Hoy le envi una receta de 60 mg de esteroides que deber tomar Neomia Dear vez al da durante 5 das.  Esto se debe principalmente a la preocupacin por el tipo de dolor de cabeza que tiene actualmente.  Tambin he recopilado pruebas de laboratorio para ESR para detectar posibles causas de infeccin/inflamacin de su dolor de cabeza.  He mandado orden para mamografa debido al bulto que tiene en la zona de la axila izquierda.  Si tiene alguna pregunta o inquietud, no dude en comunicarse con nosotros por telfono o mensaje de MyChart.   Dr. Cammy Copa de Medicina Familiar [facility]

## 2023-04-24 NOTE — Progress Notes (Cosign Needed)
    SUBJECTIVE:   CHIEF COMPLAINT / HPI:   Patient is a 42 y.o. year old female presenting with headache    Onset: 5 days ago but has had similar headache in the past Location: Left side and have veins popping Quality: sharp and pounding Frequency: can be constant through out the day and sometimes intermittent Precipitating factors: Unknown  Prior treatment: Advil or Tylenol    Associated Symptoms Nausea/vomiting: No Photophobia/phonophobia: No  Tearing of eyes: Yes and some blurry vision.  Blurry vision has since resolved. No fevers, neck stiffness, altered mental status or recent traumas. Does endorse to maintenance upper extremity numbness in the hands.   Axilla Mass Reports she has noticed and left axilla mass for the past week.  It is nontender but she is feeling under the skin.  Had any recent breast exam but reports in the past she has had drainage due to from breast abscess and has a sister who recently passed away from breast cancer.  No nipple discharges, fevers or chills at this time.  PERTINENT  PMH / PSH: Reviewed   OBJECTIVE:   BP 127/81   Pulse 80   Ht 4\' 11"  (1.499 m)   Wt 172 lb 12.8 oz (78.4 kg)   BMI 34.90 kg/m    Physical Exam General: Alert, well appearing, NAD Cardiovascular: RRR, No Murmurs, Normal S2/S2 Respiratory: CTAB, No wheezing or Rales Abdomen: No distension or tenderness Extremities: No edema on extremities   Msk: Tenderness over the left temporal head, 3-5 cm subepidermal smooth and mobile mass in left chest wall under the left axilla Breast: No notable mass but scare tissue present on the left breast Neuro: ANO 3, cranial nerve II to XII intact, normal visual field, no focal neurological deficits.   ASSESSMENT/PLAN:   Nonintractable headache Unclear cause of patient's headache.  Broad differential including giant cell arteritis, IIH, migraine headache, or cluster headaches.  The patient's reports of blurry vision, tenderness over  the left temporal area and tearing of the eye consider giant cell arteritis as a possible option given the severity associated with this will patient on high-dose steroid and obtain ESR.  Pending results of the ear will decide whether to continue steroid.  Low concerns for intracranial process at this time given reassuring neurological exam.  Also IIH considered given patient's BMI of 34, neck, upper extremity paresthesia and vision changes but low on the differential given good response to Advil and tylenol. -Rx prednisone 60 mg daily for 5 days -Order lab for ESR, -Dicussed return precautions with patient who verbalized understanding -Consider head CT if worsening headache or neurological symptoms.   Chest/ Axilla Mass Patient's with small soft mobile mass below the left axilla suspicious of lipoma however given patient's family history of breast cancer seen in sister who recently passed away from breast cancer we will obtain a diagnostic mammogram.  Last known mammogram has been over 5 years. Want to rule out lymphadenopathy and any breast etiologies.   Jerre Simon, MD [facility]

## 2023-04-24 NOTE — Assessment & Plan Note (Signed)
Unclear cause of patient's headache.  Broad differential including giant cell arteritis, IIH, migraine headache, or cluster headaches.  The patient's reports of blurry vision, tenderness over the left temporal area and tearing of the eye consider giant cell arteritis as a possible option given the severity associated with this will patient on high-dose steroid and obtain ESR.  Pending results of the ear will decide whether to continue steroid.  Low concerns for intracranial process at this time given reassuring neurological exam.  Also IIH considered given patient's BMI of 34, neck, upper extremity paresthesia and vision changes. -Rx prednisone 60 mg daily for 5 days -Order lab for ESR, -Dicussed return precautions with patient who verbalized understanding -Consider head CT if worsening headache or neurological symptoms.

## 2023-04-25 ENCOUNTER — Ambulatory Visit (INDEPENDENT_AMBULATORY_CARE_PROVIDER_SITE_OTHER): Payer: Self-pay | Admitting: Student

## 2023-04-25 ENCOUNTER — Telehealth: Payer: Self-pay

## 2023-04-25 ENCOUNTER — Other Ambulatory Visit: Payer: Self-pay | Admitting: Student

## 2023-04-25 DIAGNOSIS — R519 Headache, unspecified: Secondary | ICD-10-CM

## 2023-04-25 DIAGNOSIS — R2232 Localized swelling, mass and lump, left upper limb: Secondary | ICD-10-CM

## 2023-04-25 NOTE — Telephone Encounter (Signed)
Called patient and informed her of upcoming appointment using [facility] [place] [phone].    Diagnostic Mammogram [facility] [address], [place] [address] [phone] 05/10/2023 1030 with arrival of 1010  Patient is self pay- $411.00 on day of exam or $685.00 if she wants to be billed.  Patient also informed of $75 cancellation fee if not cancelled within 24 hours.  Glennie Hawk, CMA

## 2023-04-26 ENCOUNTER — Telehealth: Payer: Self-pay | Admitting: Student

## 2023-04-26 ENCOUNTER — Encounter: Payer: Self-pay | Admitting: Student

## 2023-04-26 LAB — SEDIMENTATION RATE: Sed Rate: 30 mm/hr (ref 0–32)

## 2023-04-26 NOTE — Telephone Encounter (Signed)
[place] interpreter: Albin Felling [phone]  Called patient to inform her that her ESR came back normal.  And advised patient to discontinue taking the prednisone.  Patient verbalized understanding.  To have some mild headache but without any vision changes or extremity paresthesia.  There are no vision changes patient say that she is having difficulty with reading occasionally.  Advised patient that her headache could also be triggered by poor vision and she would need to see an optometrist to evaluate her vision.  Patient verbalized understanding and agreeable to plan.  Return precautions discussed and patient agreeable.

## 2023-04-26 NOTE — Progress Notes (Signed)
Patient is a 42 year old female presenting today for lab draw.  She was scheduled to obtain an ESR level ordered by myself Dr. Elliot Gurney.  Patient appeared in good spirit and met with the lab tech who collected sample for LAB. ESR ordered by myself (Dr. Elliot Gurney).. Lab collected by Tessie Fass.

## 2023-05-10 ENCOUNTER — Other Ambulatory Visit: Payer: Self-pay

## 2023-05-14 ENCOUNTER — Ambulatory Visit (INDEPENDENT_AMBULATORY_CARE_PROVIDER_SITE_OTHER): Payer: Self-pay | Admitting: Family Medicine

## 2023-05-14 ENCOUNTER — Encounter: Payer: Self-pay | Admitting: Family Medicine

## 2023-05-14 ENCOUNTER — Other Ambulatory Visit: Payer: Self-pay

## 2023-05-14 VITALS — BP 129/80 | HR 71 | Wt 174.4 lb

## 2023-05-14 DIAGNOSIS — B353 Tinea pedis: Secondary | ICD-10-CM

## 2023-05-14 DIAGNOSIS — R7303 Prediabetes: Secondary | ICD-10-CM

## 2023-05-14 NOTE — Patient Instructions (Addendum)
It was wonderful to see you today! Thank you for choosing [facility].   Please bring ALL of your medications with you to every visit.   Today we talked about:  I am glad that your feet look better!  We will check lab work today to ensure your liver function is normal.  Please keep in mind that this infection could come back and if it does we can retry the oral treatment again.  Please try to clean your shower sanitizer shoes and socks to prevent reinfection. You can get the flu shot at the health department or pharmacy for cheaper price.  Please follow up in 4 months for A1c  If you haven't already, sign up for My Chart to have easy access to your labs results, and communication with your primary care physician.   We are checking some labs today. If they are abnormal, I will call you. If they are normal, I will send you a MyChart message (if it is active) or a letter in the mail. If you do not hear about your labs in the next 2 weeks, please call the office.  Call the clinic at [phone] if your symptoms worsen or you have any concerns.  Please be sure to schedule follow up at the front desk before you leave today.   Elberta Fortis, DO Family Medicine

## 2023-05-14 NOTE — Assessment & Plan Note (Signed)
Tolerating metformin XR 1000 mg daily well. Will need repeat A1c in 07/2022.

## 2023-05-14 NOTE — Progress Notes (Signed)
    SUBJECTIVE:   CHIEF COMPLAINT / HPI:   Tinea pedis Symptoms resolved with oral terbinafine treatment.  Patient satisfied reports she has not had any itching or redness of her feet.  PERTINENT  PMH / PSH: Prediabetes  OBJECTIVE:   BP 129/80   Pulse 71   Wt 174 lb 6.4 oz (79.1 kg)   SpO2 98%   BMI 35.22 kg/m    General: NAD, pleasant, able to participate in exam Skin: Feet without erythema or flaking bilaterally.  No onychomycosis of nails noted.  Distal sensation intact.  Good cap refill. Neuro: alert, no obvious focal deficits Psych: Normal affect and mood  ASSESSMENT/PLAN:   Assessment & Plan Tinea pedis of both feet Resolved, will obtain CMP liver monitoring. Prediabetes Tolerating metformin XR 1000 mg daily well. Will need repeat A1c in 07/2022.     *Due for Tdap and flu vaccinations, patient opted to receive them at health department or pharmacy due to cost   Dr. Elberta Fortis, DO [facility]

## 2023-05-14 NOTE — Assessment & Plan Note (Signed)
Resolved, will obtain CMP liver monitoring.

## 2023-05-15 ENCOUNTER — Encounter: Payer: Self-pay | Admitting: Family Medicine

## 2023-05-15 LAB — COMPREHENSIVE METABOLIC PANEL
ALT: 16 [IU]/L (ref 0–32)
AST: 16 [IU]/L (ref 0–40)
Albumin: 4.1 g/dL (ref 3.9–4.9)
Alkaline Phosphatase: 97 [IU]/L (ref 44–121)
BUN/Creatinine Ratio: 19 (ref 9–23)
BUN: 11 mg/dL (ref 6–24)
Bilirubin Total: <0.2 mg/dL (ref 0.0–1.2)
CO2: 22 mmol/L (ref 20–29)
Calcium: 9.2 mg/dL (ref 8.7–10.2)
Chloride: 105 mmol/L (ref 96–106)
Creatinine, Ser: 0.58 mg/dL (ref 0.57–1.00)
Globulin, Total: 3.1 g/dL (ref 1.5–4.5)
Glucose: 98 mg/dL (ref 70–99)
Potassium: 4 mmol/L (ref 3.5–5.2)
Sodium: 140 mmol/L (ref 134–144)
Total Protein: 7.2 g/dL (ref 6.0–8.5)
eGFR: 114 mL/min/{1.73_m2} (ref >59)

## 2023-05-24 ENCOUNTER — Other Ambulatory Visit: Payer: Self-pay

## 2023-06-20 ENCOUNTER — Ambulatory Visit: Payer: Self-pay

## 2023-07-26 ENCOUNTER — Ambulatory Visit (INDEPENDENT_AMBULATORY_CARE_PROVIDER_SITE_OTHER): Payer: Self-pay | Admitting: Student

## 2023-07-26 VITALS — BP 119/60 | HR 89 | Ht 59.0 in | Wt 170.8 lb

## 2023-07-26 DIAGNOSIS — B349 Viral infection, unspecified: Secondary | ICD-10-CM

## 2023-07-26 DIAGNOSIS — H0012 Chalazion right lower eyelid: Secondary | ICD-10-CM

## 2023-07-26 DIAGNOSIS — J101 Influenza due to other identified influenza virus with other respiratory manifestations: Secondary | ICD-10-CM

## 2023-07-26 LAB — POC SOFIA 2 FLU + SARS ANTIGEN FIA
Influenza A, POC: POSITIVE — AB
Influenza B, POC: NEGATIVE
SARS Coronavirus 2 Ag: NEGATIVE

## 2023-07-26 MED ORDER — OSELTAMIVIR PHOSPHATE 75 MG PO CAPS
75.0000 mg | ORAL_CAPSULE | Freq: Two times a day (BID) | ORAL | 0 refills | Status: DC
Start: 2023-07-26 — End: 2023-08-08

## 2023-07-26 NOTE — Patient Instructions (Addendum)
[name],  For your influenza, please take Tamiflu twice daily for five days.   The spot on your eye is called a chalazion. The best treatment for this is to place a warm washcloth over the eye several times per day to help reduce the inflammation. This should get better with time and patience.  Sra. Gonazalez,  Para la gripe, tome Tamiflu dos veces al da [employer].   La mancha que tiene en el ojo se llama chalazin. El mejor tratamiento para esto es Scientific laboratory technician un pao tibio sobre el ojo varias veces al da para ayudar a reducir la inflamacin. Esto debera mejorar con el tiempo y la paciencia.  Eliezer Mccoy, MD

## 2023-07-27 NOTE — Progress Notes (Signed)
    SUBJECTIVE:   CHIEF COMPLAINT / HPI:   R Eye Swelling Present x3 weeks with a "lump" in her lower eyelid. No real pain or discharge associated with this. Has not tried anything for it.   Fever  Viral Syndrome Since yesterday. Subjective fever, cough productive of scant white sputum, congestion. Generalized malaise. No known sick contacts. No respiratory distress and no pulmonary disease at baseline.    OBJECTIVE:   BP 119/60   Pulse 89   Ht 4\' 11"  (1.499 m)   Wt 170 lb 12.8 oz (77.5 kg)   SpO2 99%   BMI 34.50 kg/m   Gen: Sick appearing but non-toxic, conversant and NAD Eyes: EOMs intact, conjunctivae non-injected, there is a ~34mm nodule within the lower lid on the R with associated lid erythema c/w chalazion, see photo HENT: Oropharynx without exudate, erythema, or tonsillar hypertrophy Cardio: RRR, without m/r/g Pulm: Normal WOB on RA, speaking in full sentences, lungs are clear     ASSESSMENT/PLAN:   Assessment & Plan Influenza A Flu A positive. Given <24 hours since symptom onset, offered Tamiflu which she would like. Will therefore treat with Tamiflu. Supportive care reviewed as well.  - TamiFlu 75mg  BID x5 days  Chalazion of right lower eyelid Reviewed diagnosis and expected self-resolving course with patient. Reviewed use of warm compresses and expectant management. - If fails to improve could consider intralesional steroid injection or referral to ophthalmology, but no indication at this time.     Eliezer Mccoy, MD [facility]

## 2023-07-28 ENCOUNTER — Ambulatory Visit (HOSPITAL_COMMUNITY)
Admission: EM | Admit: 2023-07-28 | Discharge: 2023-07-28 | Disposition: A | Discharge status: Home / Self Care | Payer: Self-pay | Attending: Emergency Medicine | Admitting: Emergency Medicine

## 2023-07-28 ENCOUNTER — Encounter (HOSPITAL_COMMUNITY): Payer: Self-pay | Admitting: Emergency Medicine

## 2023-07-28 ENCOUNTER — Other Ambulatory Visit: Payer: Self-pay

## 2023-07-28 DIAGNOSIS — J209 Acute bronchitis, unspecified: Secondary | ICD-10-CM

## 2023-07-28 DIAGNOSIS — H00012 Hordeolum externum right lower eyelid: Secondary | ICD-10-CM

## 2023-07-28 MED ORDER — AZITHROMYCIN 250 MG PO TABS: 250.0000 mg | ORAL_TABLET | Freq: Every day | ORAL | 0 refills | Status: DC

## 2023-07-28 MED ORDER — PREDNISONE 20 MG PO TABS: 20.0000 mg | ORAL_TABLET | Freq: Every day | ORAL | 0 refills | Status: DC

## 2023-07-28 NOTE — Discharge Instructions (Addendum)
Warm compresses to the right eye 2-3 times a day.  Swelling will resolve.  You have worsening symptoms from your recent flu.  There is wheezing in both lungs.  take antibiotics and prednisone as ordered. Follow-up with primary care for worsening of symptoms.

## 2023-07-28 NOTE — ED Provider Notes (Addendum)
[facility]    CSN: [id number] Arrival date & time: 07/28/23  1353      History   Chief Complaint Chief Complaint  Patient presents with   Stye    HPI Jodi Kelly is a 42 y.o. female.   Patient presents today for evaluation of stye of the right thigh.  She is reporting that the left side of her left side of her face at the cheekbone lower eyelid has been swelling x 3 days.  She has been ill at the upper respiratory virus x 1 week.  The history is provided by the patient.    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 Authorizing Provider  azithromycin (ZITHROMAX) 250 MG tablet Take 1 tablet (250 mg total) by mouth daily. Take first 2 tablets together, then 1 every day until finished. 07/28/23  Yes Anavi Branscum,  Linde Gillis, NP  predniSONE (DELTASONE) 20 MG tablet Take 1 tablet (20 mg total) by mouth daily with breakfast. 07/28/23  Yes Rajvi Armentor, Linde Gillis, NP  atorvastatin (LIPITOR) 20 MG tablet Take 1 tablet (20 mg total) by mouth daily. 02/21/23   Elberta Fortis, MD  metFORMIN (GLUCOPHAGE-XR) 500 MG 24 hr tablet Take 2 tablets (1,000 mg total) by mouth daily with breakfast. 02/21/23   Elberta Fortis, MD  oseltamivir (TAMIFLU) 75 MG capsule Take 1 capsule (75 mg total) by mouth 2 (two) times daily. Patient not taking: Reported on 07/28/2023 07/26/23   Alicia Amel, MD  terbinafine (LAMISIL) 250 MG tablet Take 1 tablet (250 mg total) by mouth daily. 02/21/23   Elberta Fortis, MD    Family History Family History  Problem Relation Age of Onset   Diabetes Mother    Hypertension Father    Hyperlipidemia Father    Breast cancer Sister 68   Diabetes Brother    Ovarian cancer Paternal Aunt 53    Social History Social History   Tobacco Use   Smoking status: Never  Passive exposure: Never   Smokeless tobacco: Never  Vaping Use   Vaping status: Never Used  Substance Use Topics   Alcohol use: No   Drug use: No     Allergies   Hydrocodone   Review of Systems Review of Systems  Constitutional:  Positive for chills, diaphoresis and fever.  HENT:  Positive for congestion, facial swelling, rhinorrhea and sinus pressure.        Right facial swelling at the cheekbone lower eyelid area  Eyes:  Positive for itching.       Swollen right lower eyelid  Respiratory:  Positive for cough and shortness of breath.   Cardiovascular:  Positive for palpitations.  All other systems reviewed and are negative.    Physical Exam Triage Vital Signs ED Triage Vitals  Encounter Vitals Group     BP 07/28/23 1624 117/71     Systolic BP Percentile --      Diastolic BP Percentile --      Pulse Rate 07/28/23 1624 70     Resp 07/28/23 1624 18     Temp 07/28/23 1624 99 F (37.2 C)     Temp Source  07/28/23 1624 Oral     SpO2 07/28/23 1624 95 %     Weight --      Height --      Head Circumference --      Peak Flow --      Pain Score 07/28/23 1622 6     Pain Loc --      Pain Education --      Exclude from Growth Chart --    No data found.  Updated Vital Signs BP 117/71 (BP Location: Left Arm)   Pulse 70   Temp 99 F (37.2 C) (Oral)   Resp 18   LMP 07/02/2023   SpO2 95%   Visual Acuity Right Eye Distance:   Left Eye Distance:   Bilateral Distance:    Right Eye Near:   Left Eye Near:    Bilateral Near:     Physical Exam Vitals and nursing note reviewed.  HENT:     Right Ear: Tympanic membrane is erythematous.     Left Ear: Tympanic membrane is erythematous.     Nose:     Right Sinus: Maxillary sinus tenderness present.     Left Sinus: Maxillary sinus tenderness present.     Mouth/Throat:     Mouth: Mucous membranes are dry.     Pharynx: Posterior oropharyngeal erythema and postnasal drip present.  Cardiovascular:     Rate and Rhythm: Normal rate and regular rhythm.  Lymphadenopathy:     Cervical: Cervical adenopathy present.     Right cervical: Superficial cervical adenopathy present.     Left cervical: Superficial cervical adenopathy present.  Neurological:     Mental Status: She is alert.      UC Treatments / Results  Labs (all labs ordered are listed, but only abnormal results are displayed) Labs Reviewed - No data to display  EKG   Radiology No results found.  Procedures Procedures (including critical care time)  Medications Ordered in UC Medications - No data to display  Initial Impression / Assessment and Plan / UC Course  I have reviewed the triage vital signs and the nursing notes.  Pertinent labs & imaging results that were available during my care of the patient were reviewed by me and considered in my medical decision making (see chart for details).    Patient was  recently seen by her primary care doctor for influenza and stye  noted to right lower eyelid.  She continues to be concerned about swelling of the right face.  Today she has had worsening of breathing and she is wheezing bilateral.  We will start azithromycin and prednisone at this time conservative measures for the stye and to lazy M with warm compresses.  Final Clinical Impressions(s) / UC Diagnoses   Final diagnoses:  Hordeolum externum of right lower eyelid  Acute bronchitis, unspecified organism     Discharge Instructions      Warm compresses to the right eye 2-3 times a day.  Swelling will resolve.  You have worsening symptoms from your recent flu.  There is wheezing in both lungs.  take antibiotics and prednisone as ordered. Follow-up with primary care for worsening of symptoms.      ED Prescriptions     Medication Sig Dispense Auth. Provider   azithromycin (ZITHROMAX) 250 MG tablet Take 1 tablet (250 mg total) by mouth daily. Take first 2 tablets together, then 1 every day until finished. 6 tablet Ermie Glendenning, Linde Gillis, NP   predniSONE (DELTASONE) 20 MG tablet Take 1 tablet (20 mg total) by mouth daily with breakfast. 5 tablet Erick Murin, Linde Gillis, NP      PDMP not reviewed this encounter.   Nelda Marseille, NP 07/28/23 1711    Nelda Marseille, NP 07/28/23 [phone]

## 2023-07-28 NOTE — ED Triage Notes (Signed)
Knot in lower eyelid of right eye.  This started 2 weeks ago.  As not used any medications.

## 2023-08-02 ENCOUNTER — Ambulatory Visit: Payer: Self-pay | Admitting: Family Medicine

## 2023-08-06 NOTE — Progress Notes (Signed)
    SUBJECTIVE:   CHIEF COMPLAINT / HPI:   *Video Spanish interpreter used for entirety of visit*  Bump on right eyelid Seen 12/26 for similar concern, diagnosed with chalazion and recommended to do warm compresses 3 times daily.  Also seen at urgent care on 12/28 for the same concern and diagnosed with a stye and again was recommended to do warm compresses.  Reports her symptoms have slightly improved with less facial swelling but she continues to have a red bump that irritates her eye on her lower eyelid.  Doing warm compresses 3 times daily without symptom relief.  She reports some pain with eye movement and feeling like something is in her eye.  Associated with an ongoing headache.  Tinea pedis, onychomycosis Symptoms improved but states she did have an episode of itching in the last few days.  Notes some discoloration of nail still.  Would like to try the Lamisil  again.  PERTINENT  PMH / PSH: Tinea pedis, onychomycosis  OBJECTIVE:   BP 119/79   Pulse 75   Ht 4' 11 (1.499 m)   Wt 172 lb 6.4 oz (78.2 kg)   LMP 07/02/2023   SpO2 100%   BMI 34.82 kg/m    General: NAD, pleasant, able to participate in exam HEENT: Erythematous, circumscribed, soft growth on the inner surface of her right lower eyelid (see media below).  No surrounding drainage.  Mildly TTP over right frontal and maxillary sinuses.  Cardiac: RRR, no murmurs. Respiratory: CTAB, normal effort, No wheezes, rales or rhonchi Abdomen: Bowel sounds present, nontender, nondistended Extremities: no edema or cyanosis. Skin: warm and dry, no rashes noted Neuro: alert, no obvious focal deficits Psych: Normal affect and mood Feet: Right foot without erythema, cracking skin or rash.  Onychomycosis of first and fifth digits noted.  ASSESSMENT/PLAN:   Assessment & Plan Growth of eyelid Notable growth on inner eyelid of right eye, benign-appearing but causing persistent symptoms.  Normal visual acuity today.   Unfortunately patient does not have health insurance therefore will message referral coordinator about possible sliding scale payment for removal with ophthalmology. -Provided information for [facility] to call and discuss payment options for removal -Follow-up message to referral coordinator about possible other ophthalmologists providing uninsured care in the community Tinea pedis of both feet Symptomatically improved but has some onychomycosis and now return of itching.  Will initiate terbinafine  therapy pending normal CMP. -CMP to evaluate LFTs given recent past terbinafine  use, scheduled for lab appointment given no phlebotomist in clinic -If normal, start terbinafine  to 50 mg daily x 3 months   Dr. Izetta Nap, DO [facility]

## 2023-08-08 ENCOUNTER — Ambulatory Visit: Payer: Self-pay | Admitting: Family Medicine

## 2023-08-08 ENCOUNTER — Encounter: Payer: Self-pay | Admitting: Family Medicine

## 2023-08-08 VITALS — BP 119/79 | HR 75 | Ht 59.0 in | Wt 172.4 lb

## 2023-08-08 DIAGNOSIS — B353 Tinea pedis: Secondary | ICD-10-CM

## 2023-08-08 DIAGNOSIS — D492 Neoplasm of unspecified behavior of bone, soft tissue, and skin: Secondary | ICD-10-CM

## 2023-08-08 MED ORDER — TERBINAFINE HCL 250 MG PO TABS: 250.0000 mg | ORAL_TABLET | Freq: Every day | ORAL | 0 refills | Status: DC

## 2023-08-08 NOTE — Assessment & Plan Note (Signed)
 Symptomatically improved but has some onychomycosis and now return of itching.  Will initiate terbinafine  therapy pending normal CMP. -CMP to evaluate LFTs given recent past terbinafine  use, scheduled for lab appointment given no phlebotomist in clinic -If normal, start terbinafine  to 50 mg daily x 3 months

## 2023-08-08 NOTE — Patient Instructions (Addendum)
  Fue maravilloso verte hoy! Kathlynn por elegir [facility].   Traiga TODOS sus medicamentos a cada visita.   Hoy hablamos de:  1. Tienes un crecimiento en el prpado inferior. Lo remito a un oftalmlogo para radio broadcast assistant eliminacin del rea. Le envi un mensaje a nuestro coordinador de referencias para ayudarlo a personnel officer un ritmo de oficina que pueda realizar pagos de escala mvil a un costo menor.  Zondra farmer, puede llamar a [facility] y ver si pueden cotizarle un precio, pero puede resultar costoso de su bolsillo.  Lamentablemente, esto debe eliminarse y no hay nada que pueda ofrecerles hoy para mejorarlo. 2. Para los hongos en las uas de los pies, podemos continuar con el medicamento durante otros 3 meses.  Le estoy sacando 1 anlisis de sangre hoy para comprobar su funcin heptica y si es normal podemos continuar con scientist, research (medical).  Haga un seguimiento segn sea necesario para detectar sntomas persistentes.  Si an no lo ha hecho, regstrese en My Chart para tener fcil acceso a los resultados de sus laboratorios y comunicarse con su mdico de atencin primaria.   Estamos revisando algunos laboratorios hoy. Si son anormales, te freight forwarder. Si son normales, transport planner un mensaje de MyChart (si est musician) o una carta por correo. Si no recibe informacin sobre sus laboratorios en las prximas 2 semanas, llame a la oficina.  Llame a la clnica al [phone] si sus sntomas empeoran o tiene alguna inquietud.  Asegrese de programar un seguimiento en la recepcin antes de irse hoy.   Izetta Cluster, DO [facility]  It was wonderful to see you today! Thank you for choosing [facility].   Please bring ALL of your medications with you to every visit.   Today we talked about:  You have a growth on your lower eye lid. I am referring you to an eye doctor to discuss removal of the area. I messaged our referral coordinator to help get you in to an  office rhythm may do sliding scale payment for lower cost.  In the meantime you can call [facility] and see if they can  quote you a price but it may be expensive out-of-pocket.  Unfortunately this needs to be removed and there is nothing I can give you today that we will make it better. For your toenail fungus we can continue the medication for another 3 months.  I am drawing 1 blood work today to check your liver function and if it is normal we can continue with treatment.  Please follow up as needed for persistent symptoms  If you haven't already, sign up for My Chart to have easy access to your labs results, and communication with your primary care physician.   We are checking some labs today. If they are abnormal, I will call you. If they are normal, I will send you a MyChart message (if it is active) or a letter in the mail. If you do not hear about your labs in the next 2 weeks, please call the office.  Call the clinic at [phone] if your symptoms worsen or you have any concerns.  Please be sure to schedule follow up at the front desk before you leave today.   Izetta Nap, DO Family Medicine

## 2023-08-08 NOTE — Assessment & Plan Note (Signed)
 Notable growth on inner eyelid of right eye, benign-appearing but causing persistent symptoms.  Normal visual acuity today.  Unfortunately patient does not have health insurance therefore will message referral coordinator about possible sliding scale payment for removal with ophthalmology. -Provided information for [facility] to call and discuss payment options for removal -Follow-up message to referral coordinator about possible other ophthalmologists providing uninsured care in the community

## 2023-08-13 ENCOUNTER — Other Ambulatory Visit: Payer: Self-pay

## 2023-08-13 DIAGNOSIS — B353 Tinea pedis: Secondary | ICD-10-CM

## 2023-08-14 ENCOUNTER — Encounter: Payer: Self-pay | Admitting: Family Medicine

## 2023-08-14 LAB — COMPREHENSIVE METABOLIC PANEL
ALT: 13 [IU]/L (ref 0–32)
AST: 14 [IU]/L (ref 0–40)
Albumin: 4 g/dL (ref 3.9–4.9)
Alkaline Phosphatase: 97 [IU]/L (ref 44–121)
BUN/Creatinine Ratio: 14 (ref 9–23)
BUN: 8 mg/dL (ref 6–24)
Bilirubin Total: 0.3 mg/dL (ref 0.0–1.2)
CO2: 22 mmol/L (ref 20–29)
Calcium: 9.1 mg/dL (ref 8.7–10.2)
Chloride: 104 mmol/L (ref 96–106)
Creatinine, Ser: 0.59 mg/dL (ref 0.57–1.00)
Globulin, Total: 2.8 g/dL (ref 1.5–4.5)
Glucose: 100 mg/dL — ABNORMAL HIGH (ref 70–99)
Potassium: 4.3 mmol/L (ref 3.5–5.2)
Sodium: 139 mmol/L (ref 134–144)
Total Protein: 6.8 g/dL (ref 6.0–8.5)
eGFR: 114 mL/min/{1.73_m2} (ref >59)

## 2023-09-27 ENCOUNTER — Other Ambulatory Visit: Payer: Self-pay

## 2023-09-27 DIAGNOSIS — R2232 Localized swelling, mass and lump, left upper limb: Secondary | ICD-10-CM

## 2023-10-05 ENCOUNTER — Encounter: Payer: Self-pay | Admitting: Obstetrics and Gynecology

## 2023-10-07 NOTE — Progress Notes (Unsigned)
    SUBJECTIVE:   CHIEF COMPLAINT / HPI:   Tingling in hands X 1 month.  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  Feels tingling in in all fingers, left worse than right. Sometimes hard to grip things. Not wearing wrist brace and not taking anything for it.  Cyst in skin Growth on left inner thigh.  Has been getting bigger over the past 3 to 4 years.  Now rubbing on clothing and becoming bothersome's.  Another growth on her left outer thigh for the past 15 years.  PERTINENT  PMH / PSH: Tinea pedis, prediabetes  OBJECTIVE:   BP 112/79   Pulse 70   Ht 4\' 11"  (1.499 m)   Wt 174 lb 8 oz (79.2 kg)   LMP 09/26/2023   SpO2 99%   BMI 35.24 kg/m    General: NAD, pleasant, able to participate in exam Skin: Protruding growth on left inner thigh approximately 1-2cm around.  Larger, soft, mobile, nontender protruding growth on left hip approximately 4-5 cm. MSK: Full range of motion of wrist bilaterally.  Nontender to palpation.  Positive Tinel sign bilaterally.  2+ radial pulse and good distal cap refill.  ASSESSMENT/PLAN:   Assessment & Plan Carpal tunnel syndrome, bilateral Recommend conservative therapy with wrist splint both day and night. F/u in 1 month if persistent symptoms consider steroid injection. Tinea pedis of both feet Improved but continues to have some itching.  -CMP, if continues to be stable continue terbinafine x 3 months Prediabetes A1c 6.1, refill metformin 1000 mg daily. Hyperlipidemia, unspecified hyperlipidemia type Refill atorvastatin 20 mg daily. -Consider lipid panel at follow-up Skin growth -Exophytic growth on left inner thigh causing irritation most consistent with skin tag.  Will refer to Derm clinic for removal given sensitive location.   -Additional growth on left hip most consistent with lipoma but given size would likely need general surgery referral.  Will hold on referral at this time given not causing symptoms.    Dr. Elberta Fortis, DO [facility]

## 2023-10-08 ENCOUNTER — Ambulatory Visit (INDEPENDENT_AMBULATORY_CARE_PROVIDER_SITE_OTHER): Payer: Self-pay | Admitting: Family Medicine

## 2023-10-08 VITALS — BP 112/79 | HR 70 | Ht 59.0 in | Wt 174.5 lb

## 2023-10-08 DIAGNOSIS — B353 Tinea pedis: Secondary | ICD-10-CM

## 2023-10-08 DIAGNOSIS — E785 Hyperlipidemia, unspecified: Secondary | ICD-10-CM

## 2023-10-08 DIAGNOSIS — G5603 Carpal tunnel syndrome, bilateral upper limbs: Secondary | ICD-10-CM

## 2023-10-08 DIAGNOSIS — R7303 Prediabetes: Secondary | ICD-10-CM

## 2023-10-08 DIAGNOSIS — D492 Neoplasm of unspecified behavior of bone, soft tissue, and skin: Secondary | ICD-10-CM

## 2023-10-08 LAB — POCT GLYCOSYLATED HEMOGLOBIN (HGB A1C): HbA1c, POC (prediabetic range): 6.1 % (ref 5.7–6.4)

## 2023-10-08 MED ORDER — METFORMIN HCL ER 500 MG PO TB24: 1000.0000 mg | ORAL_TABLET | Freq: Every day | ORAL | 1 refills | Status: DC

## 2023-10-08 MED ORDER — ATORVASTATIN CALCIUM 20 MG PO TABS
20.0000 mg | ORAL_TABLET | Freq: Every day | ORAL | 3 refills | Status: DC
Start: 2023-10-08 — End: 2024-06-20

## 2023-10-08 MED ORDER — TERBINAFINE HCL 250 MG PO TABS: 250.0000 mg | ORAL_TABLET | Freq: Every day | ORAL | 0 refills | Status: AC

## 2023-10-08 NOTE — Assessment & Plan Note (Signed)
 A1c 6.1, refill metformin 1000 mg daily.

## 2023-10-08 NOTE — Assessment & Plan Note (Signed)
 Improved but continues to have some itching.  -CMP, if continues to be stable continue terbinafine x 3 months

## 2023-10-08 NOTE — Patient Instructions (Addendum)
 Fue maravilloso verte hoy! Karl Pock por elegir [facility].   Trae TODOS tus medicamentos a cada visita.   Hoy hablamos sobre:  1. Tienes sndrome del tnel carpiano. [place] aparatos ortopdicos todo Medical laboratory scientific officer y toda la noche durante las primeras dos semanas. Despus de eso, contina usndolos cuando hagas movimientos repetitivos y por la noche. Si tus sntomas siguen siendo molestos despus de un mes, vuelve porque es posible que necesites una inyeccin en el nervio.  2. Programa una cita en nuestra clnica de dermatologa para que te extirpen el rea de la parte interna del muslo. Creo que el rea de la parte externa del muslo puede ser difcil de extirpar, pero podemos echarle un vistazo.  3. Voy a volver a surtir tu metformina y estatina y repetiremos tu A1c hoy.  4. Tambin voy a volver a Chief Operating Officer su funcin heptica hoy y si sigue vindose bien, podemos continuar con la terbinafina durante otros 3 meses.   Por favor, haga un seguimiento en 1 mes si los sntomas del tnel carpiano persisten y 6 meses para A1c  Si an no lo ha hecho, regstrese en My Chart para tener un acceso fcil a los resultados de sus anlisis de laboratorio y Engineer, materials con su mdico de Marine scientist.  Estamos revisando algunos anlisis de laboratorio hoy. Si son anormales, lo llamar. Si son normales, Transport planner un mensaje de MyChart (si est Musician) o una carta por correo. Si no recibe noticias de sus anlisis de laboratorio en las prximas 2 semanas, llame al consultorio.  Llame a la clnica al [phone] si sus sntomas empeoran o si tiene alguna inquietud.  Asegrese de programar un seguimiento en la recepcin antes de irse hoy.   Elberta Fortis, DO Medicina familiar  It was wonderful to see you today! Thank you for choosing [facility].   Please bring ALL of your medications with you to every visit.   Today we talked about:  You have carpel tunnel syndrome. Please wear  braces all day and night for the first two weeks. After that please continue to use when doing repetitive movements and at night. If your symptoms are still bothersome after a month, please come back as you may need injection of the nerve.  Please schedule to be seen in our Dermatology clinic to have the area on your inner thigh removed. I think the area on your outer thigh may be hard to remove but we can take a look at it.  I am refilling your metformin and statin and we will repeat your A1c today. I am also rechecking her liver function today and if it continues to look good we can continue the terbinafine for another 3 months.  Please follow up in 1 month if persistent carpel tunnel symptoms and 6 months for A1c  If you haven't already, sign up for My Chart to have easy access to your labs results, and communication with your primary care physician.   We are checking some labs today. If they are abnormal, I will call you. If they are normal, I will send you a MyChart message (if it is active) or a letter in the mail. If you do not hear about your labs in the next 2 weeks, please call the office.  Call the clinic at [phone] if your symptoms worsen or you have any concerns.  Please be sure to schedule follow up at the front desk before you leave today.   Elberta Fortis, DO  Family Medicine

## 2023-10-09 ENCOUNTER — Ambulatory Visit: Payer: Self-pay | Admitting: *Deleted

## 2023-10-09 ENCOUNTER — Encounter: Payer: Self-pay | Admitting: Family Medicine

## 2023-10-09 VITALS — Ht 58.5 in | Wt 169.0 lb

## 2023-10-09 DIAGNOSIS — Z01419 Encounter for gynecological examination (general) (routine) without abnormal findings: Secondary | ICD-10-CM

## 2023-10-09 DIAGNOSIS — Z1211 Encounter for screening for malignant neoplasm of colon: Secondary | ICD-10-CM

## 2023-10-09 DIAGNOSIS — N644 Mastodynia: Secondary | ICD-10-CM

## 2023-10-09 LAB — COMPREHENSIVE METABOLIC PANEL
ALT: 15 IU/L (ref 0–32)
AST: 16 IU/L (ref 0–40)
Albumin: 4.2 g/dL (ref 3.9–4.9)
Alkaline Phosphatase: 101 IU/L (ref 44–121)
BUN/Creatinine Ratio: 16 (ref 9–23)
BUN: 9 mg/dL (ref 6–24)
Bilirubin Total: <0.2 mg/dL (ref 0.0–1.2)
CO2: 20 mmol/L (ref 20–29)
Calcium: 9.3 mg/dL (ref 8.7–10.2)
Chloride: 106 mmol/L (ref 96–106)
Creatinine, Ser: 0.58 mg/dL (ref 0.57–1.00)
Globulin, Total: 3.2 g/dL (ref 1.5–4.5)
Glucose: 97 mg/dL (ref 70–99)
Potassium: 5.4 mmol/L — ABNORMAL HIGH (ref 3.5–5.2)
Sodium: 139 mmol/L (ref 134–144)
Total Protein: 7.4 g/dL (ref 6.0–8.5)
eGFR: 114 mL/min/{1.73_m2} (ref >59)

## 2023-10-09 NOTE — Progress Notes (Signed)
 [name] is a 43 y.o. [id number] female who presents to [facility] clinic today with complaint of left axilla lump x 4 months that has increased in size.    Pap Smear: Pap smear completed today. Last Pap smear was 08/04/2022 at [facility] clinic and was normal with negative HPV. Per patient has history of three abnormal Pap smears 07/27/2021 that was ASCUS with negative HPV, 09/12/2017 that was ASCUS with negative HPV, and 10/05/2014 that was ASC-H with negative HPV that no follow up was completed. Last Pap smear result is available in Epic.   Physical exam: Breasts Breasts symmetrical. Scar observed right outer breast lower quadrant breast due to history of lumpectomy due to breast infection. Scar observed left upper inner breast due to history of lumpectomy due to breast infection. No nipple retraction bilateral breasts. No nipple discharge bilateral breasts. No lymphadenopathy. No lumps palpated bilateral breasts. Lump in patients area of concern is a fatty area located at 10 o'clock 10 cm from the nipple next to her breast on the side of her chest. Complaints of right outer breast pain on exam. Complaints of left diffuse breast pain on exam.  Pelvic/Bimanual Ext Genitalia No lesions, no swelling and no discharge observed on external genitalia.        Vagina Vagina pink and normal texture. No lesions or discharge observed in vagina.      Cervix Cervix is present. Cervix pink and of normal texture. No discharge observed.    Uterus Uterus is present and palpable. Uterus in normal position and normal size.        Adnexae Bilateral ovaries present and palpable. No tenderness on palpation.         Rectovaginal No rectal exam completed today since patient had no rectal complaints. No skin abnormalities observed on exam.     Smoking History: Patient has never smoked.   Patient Navigation: Patient education provided. Access to services provided for patient through [place]  program. Spanish interpreter Jodi Kelly from [facility] provided.   Colorectal Cancer Screening: Per patient has never had colonoscopy completed. FIT Test given to patient to complete. No complaints today.    Breast and Cervical Cancer Risk Assessment: Patient has family history of her sister having breast cancer. Patient has no known genetic mutations or history of radiation treatment to the chest before age 35. Patient does not have history of cervical dysplasia, immunocompromised, or DES exposure in-utero.  Risk Scores as of Encounter on 10/09/2023     Jodi Kelly           5-year 2.44%   Lifetime 29.33%            Last calculated by Jodi Heidelberg, RN on 10/09/2023 at 10:51 PM        A: BCCCP exam with pap smear Complaint of left axilla lump.  P: Referred patient to the [facility] for a diagnostic mammogram. Appointment scheduled Thursday, October 11, 2023 at 0940.  Jodi Heidelberg, RN 10/09/2023 1:49 PM

## 2023-10-09 NOTE — Patient Instructions (Signed)
 Explained breast self awareness with Delana Meyer. Pap smear completed today. Let her know that her next Pap smear will be due based on the result of today's Pap smear. Referred patient to the [facility] for a diagnostic mammogram. Appointment scheduled Thursday, October 11, 2023 at 0940. Patient aware of appointment and will be there. Let patient know will follow up with her within the next couple weeks with results of Pap smear by letter or phone. Delana Meyer verbalized understanding.  Zebediah Beezley, Kathaleen Maser, RN 1:49 PM

## 2023-10-11 ENCOUNTER — Ambulatory Visit
Admission: RE | Admit: 2023-10-11 | Discharge: 2023-10-11 | Disposition: A | Discharge status: Home / Self Care | Payer: Self-pay | Source: Ambulatory Visit | Attending: Obstetrics and Gynecology | Admitting: Obstetrics and Gynecology

## 2023-10-11 ENCOUNTER — Other Ambulatory Visit: Payer: Self-pay | Admitting: Obstetrics and Gynecology

## 2023-10-11 DIAGNOSIS — R2232 Localized swelling, mass and lump, left upper limb: Secondary | ICD-10-CM

## 2023-10-13 LAB — CYTOLOGY - PAP
Comment: NEGATIVE
Diagnosis: NEGATIVE
High risk HPV: NEGATIVE

## 2023-10-18 ENCOUNTER — Ambulatory Visit: Payer: Self-pay | Admitting: Family Medicine

## 2023-10-18 ENCOUNTER — Ambulatory Visit: Payer: Self-pay

## 2023-10-18 VITALS — BP 104/80 | HR 89 | Temp 98.1°F | Wt 171.0 lb

## 2023-10-18 DIAGNOSIS — L918 Other hypertrophic disorders of the skin: Secondary | ICD-10-CM

## 2023-10-18 NOTE — Progress Notes (Signed)
    SUBJECTIVE:   CHIEF COMPLAINT / HPI:   Jodi Kelly is a 43 y.o. female  presenting for evaluation for skin tag removal. Referred by Dr. Ardyth Harps for removal of skin tag licated on inner left thigh near the inguinal space. Present for many years. Has slowly been growing in size.   In person interpreter present.   PERTINENT  PMH / PSH: Reviewed and updated   OBJECTIVE:   BP 104/80   Pulse 89   Temp 98.1 F (36.7 C)   Wt 171 lb (77.6 kg)   LMP 09/26/2023 (Exact Date)   SpO2 96%   BMI 35.13 kg/m   Well-appearing, no acute distress Skin: 1-2 cm mobile raised lesion on left inner thigh consistent with skin tag.    ASSESSMENT/PLAN:   Skin tag Excision of Benign Skin Lesion Procedure Note [facility] Apr 07, 2020 1 min read  PRE-OP DIAGNOSIS: Skin tag POST-OP DIAGNOSIS: Same  PROCEDURE: skin lesion excision Performing Physician: Glendale Chard, DO  Supervising Physician (if applicable): Dr. Lum Babe   PROCEDURE:   Shave Biopsy   (Size 1 cm )  The area surrounding the skin lesion was prepared and draped in the  usual sterile manner. The lesion was removed in the usual manner by the  biopsy method noted above. Hemostasis was assured.  Closure:    3-0 vicryl suture x2   Followup: The patient tolerated the procedure well without  complications.  Standard post-procedure care is explained and return  precautions are given.     Glendale Chard, DO [facility]

## 2023-10-18 NOTE — Progress Notes (Deleted)
    SUBJECTIVE:   CHIEF COMPLAINT / HPI:   ***  PERTINENT  PMH / PSH: ***  OBJECTIVE:   BP 104/80   Pulse 89   Temp 98.1 F (36.7 C)   Wt 171 lb (77.6 kg)   LMP 09/26/2023 (Exact Date)   SpO2 96%   BMI 35.13 kg/m   ***  ASSESSMENT/PLAN:   Skin tag Excision of Benign Skin Lesion Procedure Note [facility] Apr 07, 2020 1 min read  PRE-OP DIAGNOSIS: _ POST-OP DIAGNOSIS: Same  PROCEDURE: skin lesion excision Performing Physician: _  Supervising Physician (if applicable): _  PROCEDURE:  _  Shave Biopsy    _  Scissors        _  Cryotherapy        _  Punch (Size _)  The area surrounding the skin lesion was prepared and draped in the  usual sterile manner. The lesion was removed in the usual manner by the  biopsy method noted above. Hemostasis was assured.  Closure:     _  Monsel's for hemostasis              _  suture _                   _  None  Followup: The patient tolerated the procedure well without  complications.  Standard post-procedure care is explained and return  precautions are given.    Janit Pagan, MD [facility]

## 2023-10-18 NOTE — Assessment & Plan Note (Addendum)
 Excision of Benign Skin Lesion Procedure Note [facility] Apr 07, 2020 1 min read  PRE-OP DIAGNOSIS: Skin tag POST-OP DIAGNOSIS: Same  PROCEDURE: skin lesion excision Performing Physician: Glendale Chard, DO  Supervising Physician (if applicable): Dr. Lum Babe   PROCEDURE:   Shave Biopsy   (Size 1 cm )  The area surrounding the skin lesion was prepared and draped in the  usual sterile manner. The lesion was removed in the usual manner by the  biopsy method noted above. Hemostasis was assured.  Closure:    3-0 vicryl suture x2   Followup: The patient tolerated the procedure well without  complications.  Standard post-procedure care is explained and return  precautions are given.

## 2023-10-18 NOTE — Patient Instructions (Signed)
 Hemos puesto puntos de sutura disolubles en la zona donde le quitamos el lunar. Debe dejar una curita puesta durante 24 horas. Puede lavar la zona con agua y jabn suavemente.  Si nota algn signo de infeccin, como hinchazn, aumento del enrojecimiento o supuracin de la herida, venga a que lo veamos.  Si desea que en [facility] le eliminemos la mancha del muslo, llmenos y avsenos. Necesitaremos reservar [employer].

## 2023-10-18 NOTE — Progress Notes (Signed)
 Both informed verbal and written consent obtained. Unfortunately, she signed on a different consent form which was identified after she left.  We have updated the consent form with the right name and procedure.

## 2023-10-25 NOTE — Progress Notes (Signed)
 Normal pap letter mailed 10/25/23. Repeat pap 1 yr.

## 2023-10-30 ENCOUNTER — Other Ambulatory Visit: Payer: Self-pay | Admitting: Surgery

## 2023-11-14 ENCOUNTER — Other Ambulatory Visit: Payer: Self-pay

## 2023-11-14 ENCOUNTER — Encounter (HOSPITAL_BASED_OUTPATIENT_CLINIC_OR_DEPARTMENT_OTHER): Payer: Self-pay | Admitting: Surgery

## 2023-11-15 ENCOUNTER — Encounter (HOSPITAL_BASED_OUTPATIENT_CLINIC_OR_DEPARTMENT_OTHER)
Admission: RE | Admit: 2023-11-15 | Discharge: 2023-11-15 | Disposition: A | Discharge status: Home / Self Care | Source: Ambulatory Visit | Attending: Surgery | Admitting: Surgery

## 2023-11-15 DIAGNOSIS — Z01818 Encounter for other preprocedural examination: Secondary | ICD-10-CM | POA: Insufficient documentation

## 2023-11-15 DIAGNOSIS — E119 Type 2 diabetes mellitus without complications: Secondary | ICD-10-CM | POA: Insufficient documentation

## 2023-11-15 LAB — BASIC METABOLIC PANEL WITH GFR
Anion gap: 9 (ref 5–15)
BUN: 6 mg/dL (ref 6–20)
CO2: 23 mmol/L (ref 22–32)
Calcium: 8.8 mg/dL — ABNORMAL LOW (ref 8.9–10.3)
Chloride: 104 mmol/L (ref 98–111)
Creatinine, Ser: 0.53 mg/dL (ref 0.44–1.00)
GFR, Estimated: >60 mL/min (ref >60)
Glucose, Bld: 144 mg/dL — ABNORMAL HIGH (ref 70–99)
Potassium: 4 mmol/L (ref 3.5–5.1)
Sodium: 136 mmol/L (ref 135–145)

## 2023-11-15 MED ORDER — CHLORHEXIDINE GLUCONATE CLOTH 2 % EX PADS: 6.0000 | MEDICATED_PAD | Freq: Once | CUTANEOUS | Status: DC

## 2023-11-15 MED ORDER — ENSURE PRE-SURGERY PO LIQD: 296.0000 mL | Freq: Once | ORAL | Status: DC

## 2023-11-15 NOTE — Progress Notes (Signed)

## 2023-11-19 ENCOUNTER — Encounter: Payer: Self-pay | Admitting: Family Medicine

## 2023-11-19 NOTE — Progress Notes (Deleted)
    SUBJECTIVE:   Chief compliant/HPI: annual examination  GLENA PHARRIS is a 43 y.o. who presents today for an annual exam.   *Colon cancer screening  History tabs reviewed and updated ***.   Review of systems form reviewed and notable for ***.   OBJECTIVE:   LMP 10/18/2023 (Exact Date)   ***  ASSESSMENT/PLAN:   Assessment & Plan Prediabetes  Annual Examination  See AVS for age appropriate recommendations.   PHQ score ***, reviewed and discussed.  Blood pressure reviewed and at goal ***.  Asked about intimate partner violence and resources given as appropriate  The patient currently uses *** for contraception. Folate recommended as appropriate, minimum of 400 mcg per day.   Considered the following items based upon USPSTF recommendations: Diabetes screening: {discussed/ordered:14545} Screening for elevated cholesterol: {discussed/ordered:14545} HIV testing: {discussed/ordered:14545} Hepatitis C: {discussed/ordered:14545} Hepatitis B: {discussed/ordered:14545} Syphilis if at high risk: {discussed/ordered:14545} GC/CT {GC/CT screening :23818} Reviewed risk factors for latent tuberculosis and {not indicated/requested/declined:14582} Reviewed risk factors for osteoporosis. Using FRAX tool estimated risk of major osteoporotic fracture of  ***%, early screening {ordered not order:23822::"not ordered"}   Discussed family history, BRCA testing {not indicated/requested/declined:14582}. Tool used to risk stratify was ***.  Cervical cancer screening: {PAPTYPE:23819} Breast cancer screening: {mammoscreen:23820} Colorectal cancer screening: {crcscreen:23821::"discussed, colonoscopy ordered"} if age 3 or over.   Follow up in 1 *** year or sooner if indicated.    Jonne Netters, MD [facility]

## 2023-11-21 NOTE — H&P (Signed)
 REFERRING PHYSICIAN: Jonne Netters PROVIDER: Debi Fall, MD MRN: [id number] DOB: Jul 21, 1981  Subjective   Chief Complaint: New Consultation (Left breast lipoma)  History of Present Illness: Jodi Kelly is a 43 y.o. female who is seen as an office consultation for evaluation of New Consultation (Left breast lipoma)  This is a 43 year old female who is referred here for evaluation of a mass in her left breast near the chest wall laterally. She only noticed the mass about 4 to 5 months ago. It causes discomfort. She underwent mammograms and ultrasound showing a 5.7 x 1.2 x 3.4 cm mass laterally toward the axilla. It had the consistency of a lipoma. Unfortunately, she has a very strong family history of breast cancer and had a sister die from breast cancer at age 30. She has also had lumpectomies performed on both breast for benign disease herself. She also has developed a pedunculated skin mass on her left proximal thigh. She reports that her clothing will catch this and will occasionally become irritated and cause pain. She has no cardiopulmonary issues  Review of Systems: A complete review of systems was obtained from the patient. I have reviewed this information and discussed as appropriate with the patient. See HPI as well for other ROS.  ROS   Medical History: History reviewed. No pertinent past medical history.  There is no problem list on file for this patient.  Past Surgical History:  Procedure Laterality Date  beast excisional biopsy  CESAREAN SECTION  tubal ligation surgery    Allergies  Allergen Reactions  Hydrocodone Nausea And Vomiting   Current Outpatient Medications on File Prior to Visit  Medication Sig Dispense Refill  metFORMIN  (GLUCOPHAGE -XR) 500 MG XR tablet Take 1,000 mg by mouth daily with breakfast   No current facility-administered medications on file prior to visit.   Family History  Problem Relation Age of Onset  Diabetes Mother   Hyperlipidemia (Elevated cholesterol) Father  High blood pressure (Hypertension) Father  Diabetes Sister  Diabetes Brother    Social History   Tobacco Use  Smoking Status Never  Smokeless Tobacco Never    Social History   Socioeconomic History  Marital status: Married  Tobacco Use  Smoking status: Never  Smokeless tobacco: Never  Vaping Use  Vaping status: Never Used  Substance and Sexual Activity  Alcohol use: Defer  Drug use: Defer   Social Drivers of Health   Food Insecurity: No Food Insecurity (10/09/2023)  Received from [facility]  Hunger Vital Sign  Worried About Running Out of Food in the Last Year: Never true  Ran Out of Food in the Last Year: Never true  Transportation Needs: No Transportation Needs (10/09/2023)  Received from [facility]  Lack of Transportation (Medical): No  Lack of Transportation (Non-Medical): No  Housing Stability: Unknown (10/30/2023)  Housing Stability Vital Sign  Homeless in the Last Year: No   Objective:   Vitals:   BP: 128/82  Pulse: 70  Temp: 36.8 C (98.2 F)  SpO2: 98%  Weight: 76.7 kg (169 lb 3.2 oz)  Height: 149.9 cm (4\' 11" )  PainSc: 0-No pain   Body mass index is 34.17 kg/m.  Physical Exam   She appears well on exam  On breast exam, there is a soft mass laterally at the very edge of the breast toward the chest wall on the left side. It measures and was 6 cm. It is mobile but along the ribs. There is no axillary adenopathy.  She has well-healed incisions on both her breast from previous lumpectomies.  There is a loculated 4 cm mass on her proximal thigh. It is not discolored. There is no inguinal adenopathy  Labs, Imaging and Diagnostic Testing: I have reviewed her mammograms and ultrasound  Assessment and Plan:   Diagnoses and all orders for this visit:  Mass of left breast, unspecified quadrant  Abnormal skin growth   Given her strong family history of breast cancer,  excision of the left breast mass is strongly recommended for complete histologic evaluation to rule out malignancy. I would also recommend removal of the pedunculated abnormal skin mass on her left proximal thigh for evaluation histologically of that mass is well to a malignancy. We discussed the reasons for this in detail. I explained the surgical procedures in detail. We discussed the risks which includes but is not limited to bleeding, infection, the need for further procedures if malignancy is found, postoperative recovery, etc. She understands and wished to proceed with surgery which will be scheduled

## 2023-11-22 ENCOUNTER — Ambulatory Visit (HOSPITAL_BASED_OUTPATIENT_CLINIC_OR_DEPARTMENT_OTHER): Admitting: Anesthesiology

## 2023-11-22 ENCOUNTER — Other Ambulatory Visit: Payer: Self-pay

## 2023-11-22 ENCOUNTER — Ambulatory Visit (HOSPITAL_BASED_OUTPATIENT_CLINIC_OR_DEPARTMENT_OTHER)
Admission: RE | Admit: 2023-11-22 | Discharge: 2023-11-22 | Disposition: A | Discharge status: Home / Self Care | Attending: Surgery | Admitting: Surgery

## 2023-11-22 ENCOUNTER — Encounter (HOSPITAL_BASED_OUTPATIENT_CLINIC_OR_DEPARTMENT_OTHER): Payer: Self-pay | Admitting: Surgery

## 2023-11-22 ENCOUNTER — Encounter (HOSPITAL_BASED_OUTPATIENT_CLINIC_OR_DEPARTMENT_OTHER)
Admission: RE | Disposition: A | Discharge status: Home / Self Care | Payer: Self-pay | Source: Home / Self Care | Attending: Surgery

## 2023-11-22 DIAGNOSIS — N6002 Solitary cyst of left breast: Secondary | ICD-10-CM | POA: Insufficient documentation

## 2023-11-22 DIAGNOSIS — Z7984 Long term (current) use of oral hypoglycemic drugs: Secondary | ICD-10-CM | POA: Insufficient documentation

## 2023-11-22 DIAGNOSIS — L910 Hypertrophic scar: Secondary | ICD-10-CM | POA: Insufficient documentation

## 2023-11-22 DIAGNOSIS — E119 Type 2 diabetes mellitus without complications: Secondary | ICD-10-CM | POA: Insufficient documentation

## 2023-11-22 DIAGNOSIS — Z803 Family history of malignant neoplasm of breast: Secondary | ICD-10-CM | POA: Insufficient documentation

## 2023-11-22 DIAGNOSIS — N6320 Unspecified lump in the left breast, unspecified quadrant: Secondary | ICD-10-CM

## 2023-11-22 DIAGNOSIS — Z01818 Encounter for other preprocedural examination: Secondary | ICD-10-CM

## 2023-11-22 HISTORY — PX: BREAST CYST EXCISION: SHX579

## 2023-11-22 HISTORY — PX: EXCISION MASS LOWER EXTREMETIES: SHX6705

## 2023-11-22 HISTORY — DX: Other specified postprocedural states: Z98.890

## 2023-11-22 HISTORY — DX: Type 2 diabetes mellitus without complications: E11.9

## 2023-11-22 LAB — GLUCOSE, CAPILLARY
Glucose-Capillary: 111 mg/dL — ABNORMAL HIGH (ref 70–99)
Glucose-Capillary: 140 mg/dL — ABNORMAL HIGH (ref 70–99)

## 2023-11-22 LAB — POCT PREGNANCY, URINE: Preg Test, Ur: NEGATIVE

## 2023-11-22 SURGERY — EXCISION, CYST, BREAST
Anesthesia: General | Site: Breast | Laterality: Left

## 2023-11-22 MED ORDER — 0.9 % SODIUM CHLORIDE (POUR BTL) OPTIME
TOPICAL | Status: DC | PRN
Start: 2023-11-22 — End: 2023-11-22
  Administered 2023-11-22: 1000 mL

## 2023-11-22 MED ORDER — AMISULPRIDE (ANTIEMETIC) 5 MG/2ML IV SOLN: 10.0000 mg | Freq: Once | INTRAVENOUS | Status: DC | PRN

## 2023-11-22 MED ORDER — FENTANYL CITRATE (PF) 100 MCG/2ML IJ SOLN
INTRAMUSCULAR | Status: AC
  Filled 2023-11-22: qty 2

## 2023-11-22 MED ORDER — CIPROFLOXACIN IN D5W 400 MG/200ML IV SOLN: 400.0000 mg | INTRAVENOUS | Status: DC

## 2023-11-22 MED ORDER — FENTANYL CITRATE (PF) 100 MCG/2ML IJ SOLN: 25.0000 ug | INTRAMUSCULAR | Status: DC | PRN

## 2023-11-22 MED ORDER — BUPIVACAINE-EPINEPHRINE (PF) 0.5% -1:200000 IJ SOLN
INTRAMUSCULAR | Status: AC
  Filled 2023-11-22: qty 30

## 2023-11-22 MED ORDER — TRAMADOL HCL 50 MG PO TABS: 50.0000 mg | ORAL_TABLET | Freq: Four times a day (QID) | ORAL | 0 refills | Status: DC | PRN

## 2023-11-22 MED ORDER — PROPOFOL 10 MG/ML IV BOLUS
INTRAVENOUS | Status: DC | PRN
  Administered 2023-11-22: 200 mg via INTRAVENOUS

## 2023-11-22 MED ORDER — OXYCODONE HCL 5 MG/5ML PO SOLN: 5.0000 mg | Freq: Once | ORAL | Status: DC | PRN

## 2023-11-22 MED ORDER — ACETAMINOPHEN 500 MG PO TABS: 1000.0000 mg | ORAL_TABLET | ORAL | Status: DC

## 2023-11-22 MED ORDER — DEXAMETHASONE SODIUM PHOSPHATE 4 MG/ML IJ SOLN
INTRAMUSCULAR | Status: DC | PRN
  Administered 2023-11-22: 5 mg via INTRAVENOUS

## 2023-11-22 MED ORDER — OXYCODONE HCL 5 MG PO TABS: 5.0000 mg | ORAL_TABLET | Freq: Once | ORAL | Status: DC | PRN

## 2023-11-22 MED ORDER — CIPROFLOXACIN IN D5W 400 MG/200ML IV SOLN
INTRAVENOUS | Status: AC
  Filled 2023-11-22: qty 200

## 2023-11-22 MED ORDER — CIPROFLOXACIN IN D5W 400 MG/200ML IV SOLN
INTRAVENOUS | Status: DC | PRN
  Administered 2023-11-22: 400 mg via INTRAVENOUS

## 2023-11-22 MED ORDER — DIPHENHYDRAMINE HCL 50 MG/ML IJ SOLN
INTRAMUSCULAR | Status: DC | PRN
  Administered 2023-11-22: 12.5 mg via INTRAVENOUS

## 2023-11-22 MED ORDER — FENTANYL CITRATE (PF) 100 MCG/2ML IJ SOLN
INTRAMUSCULAR | Status: DC | PRN
Start: 2023-11-22 — End: 2023-11-22
  Administered 2023-11-22: 100 ug via INTRAVENOUS

## 2023-11-22 MED ORDER — ACETAMINOPHEN 500 MG PO TABS
ORAL_TABLET | ORAL | Status: AC
  Filled 2023-11-22: qty 2

## 2023-11-22 MED ORDER — MIDAZOLAM HCL 5 MG/5ML IJ SOLN
INTRAMUSCULAR | Status: DC | PRN
  Administered 2023-11-22: 2 mg via INTRAVENOUS

## 2023-11-22 MED ORDER — SCOPOLAMINE 1 MG/3DAYS TD PT72
MEDICATED_PATCH | TRANSDERMAL | Status: AC
  Filled 2023-11-22: qty 1

## 2023-11-22 MED ORDER — SCOPOLAMINE 1 MG/3DAYS TD PT72
1.0000 | MEDICATED_PATCH | TRANSDERMAL | Status: DC
  Administered 2023-11-22: 1.5 mg via TRANSDERMAL

## 2023-11-22 MED ORDER — LACTATED RINGERS IV SOLN: INTRAVENOUS | Status: DC | PRN

## 2023-11-22 MED ORDER — LACTATED RINGERS IV SOLN: INTRAVENOUS | Status: DC

## 2023-11-22 MED ORDER — MIDAZOLAM HCL 2 MG/2ML IJ SOLN
INTRAMUSCULAR | Status: AC
  Filled 2023-11-22: qty 2

## 2023-11-22 MED ORDER — EPHEDRINE SULFATE (PRESSORS) 50 MG/ML IJ SOLN
INTRAMUSCULAR | Status: DC | PRN
Start: 2023-11-22 — End: 2023-11-22
  Administered 2023-11-22 (×2): 10 mg via INTRAVENOUS

## 2023-11-22 MED ORDER — LIDOCAINE HCL (CARDIAC) PF 100 MG/5ML IV SOSY
PREFILLED_SYRINGE | INTRAVENOUS | Status: DC | PRN
  Administered 2023-11-22: 60 mg via INTRAVENOUS
  Administered 2023-11-22: 4 mg via INTRAVENOUS

## 2023-11-22 MED ORDER — BUPIVACAINE-EPINEPHRINE 0.5% -1:200000 IJ SOLN
INTRAMUSCULAR | Status: DC | PRN
  Administered 2023-11-22: 20 mL

## 2023-11-22 MED ORDER — ACETAMINOPHEN 500 MG PO TABS
1000.0000 mg | ORAL_TABLET | Freq: Once | ORAL | Status: AC
  Administered 2023-11-22: 1000 mg via ORAL

## 2023-11-22 SURGICAL SUPPLY — 31 items
BLADE SURG 15 STRL LF DISP TIS (BLADE) ×2 IMPLANT
CANISTER SUCT 1200ML W/VALVE (MISCELLANEOUS) IMPLANT
CHLORAPREP W/TINT 26 (MISCELLANEOUS) ×2 IMPLANT
CLIP TI WIDE RED SMALL 6 (CLIP) IMPLANT
COVER BACK TABLE 60X90IN (DRAPES) ×2 IMPLANT
COVER MAYO STAND STRL (DRAPES) ×2 IMPLANT
DERMABOND ADVANCED .7 DNX12 (GAUZE/BANDAGES/DRESSINGS) ×2 IMPLANT
DRAPE LAPAROTOMY 100X72 PEDS (DRAPES) ×2 IMPLANT
DRAPE UTILITY XL STRL (DRAPES) ×2 IMPLANT
ELECTRODE REM PT RTRN 9FT ADLT (ELECTROSURGICAL) ×2 IMPLANT
GAUZE SPONGE 4X4 12PLY STRL LF (GAUZE/BANDAGES/DRESSINGS) ×2 IMPLANT
GLOVE SURG SIGNA 7.5 PF LTX (GLOVE) ×2 IMPLANT
GOWN STRL REUS W/ TWL LRG LVL3 (GOWN DISPOSABLE) ×2 IMPLANT
GOWN STRL REUS W/ TWL XL LVL3 (GOWN DISPOSABLE) ×2 IMPLANT
KIT MARKER MARGIN INK (KITS) ×2 IMPLANT
NDL HYPO 25X1 1.5 SAFETY (NEEDLE) ×2 IMPLANT
NEEDLE HYPO 25X1 1.5 SAFETY (NEEDLE) ×2 IMPLANT
NS IRRIG 1000ML POUR BTL (IV SOLUTION) ×2 IMPLANT
PACK BASIN DAY SURGERY FS (CUSTOM PROCEDURE TRAY) ×2 IMPLANT
PENCIL SMOKE EVACUATOR (MISCELLANEOUS) ×2 IMPLANT
SLEEVE SCD COMPRESS KNEE MED (STOCKING) IMPLANT
SPIKE FLUID TRANSFER (MISCELLANEOUS) IMPLANT
SPONGE T-LAP 4X18 ~~LOC~~+RFID (SPONGE) ×2 IMPLANT
SUT MNCRL AB 4-0 PS2 18 (SUTURE) ×2 IMPLANT
SUT SILK 2 0 SH (SUTURE) ×2 IMPLANT
SUT VIC AB 3-0 SH 27X BRD (SUTURE) ×2 IMPLANT
SYR CONTROL 10ML LL (SYRINGE) ×2 IMPLANT
TOWEL GREEN STERILE FF (TOWEL DISPOSABLE) ×2 IMPLANT
TRAY FAXITRON CT DISP (TRAY / TRAY PROCEDURE) IMPLANT
TUBE CONNECTING 20X1/4 (TUBING) IMPLANT
YANKAUER SUCT BULB TIP NO VENT (SUCTIONS) IMPLANT

## 2023-11-22 NOTE — Op Note (Signed)
   Gilles Lacks RACHELL DRUCKENMILLER 11/22/2023   Pre-op Diagnosis: LEFT BREAST/CHEST WALL MASS, LEFT THIGH MASS     Post-op Diagnosis: same  Procedure(s): EXCISION LEFT BREAST/CHEST WALL MASS (6 CM) EXCISION LEFT THIGH MASS (4 CM)  Surgeon(s): Oza Blumenthal, MD  Anesthesia: General  Staff:  Circulator: Carren Civatte, RN Scrub Person: Irven Manson Circulator Assistant: Ricke Charleston, RN  Estimated Blood Loss: Minimal               Specimens: SENT TO PATH  Indications: This is a 43 year old female who presents with a large mass in the left lateral breast toward the chest wall.  The mass measures 6 cm in ultrasound.  She also has a mass that is pedunculated on her left proximal thigh measuring 4 cm.  She has a strong family history of breast cancer with a sister passing from breast cancer at age 69.  She herself has had bilateral breast lumpectomies.  Findings: The mass along the left lateral breast/chest wall appear consistent with a lipoma and measured 6 cm.  The pedunculated skin lesion measured 4 cm.  Both were sent to pathology for evaluation  Procedure: The patient was brought to the operating room identified as correct patient.  She was placed upon the operating room table and general anesthesia was induced.  Her left breast, axilla, chest wall, down to the left thigh were prepped and draped in the usual sterile fashion.  I anesthetized the skin over the palpable mass in the left lateral breast with Marcaine .  I then made a longitudinal incision with a scalpel.  I then dissected down into the subcutaneous tissue and encountered what appeared to be a large lipoma measuring 6 cm.  I excised it in its entirety with the cautery.  There were no other palpable concerns.  I achieved hemostasis with the cautery.  I then closed the subcutaneous tissue with interrupted 3-0 Vicryl sutures and closed skin with a running 4-0 Monocryl.  The mass on the proximal thigh was pedunculated.  I grasped it  with an Allis clamp and then anesthetized the surrounding skin with Marcaine .  I then performed an elliptical incision at the base of the mass with a scalpel.  I then completely resected the mass with the electrocautery.  It likewise was sent to pathology for evaluation.  Hemostasis was achieved with the cautery.  I closed this incision also with interrupted 3-0 Vicryl sutures and a running 4-0 Monocryl.  Dermabond was then applied to both incisions.  The patient tolerated the procedure well.  All the counts were correct at the end of the procedure.  The patient was then extubated in the operating room and taken in a stable condition to the recovery room.        Oza Blumenthal   Date: 11/22/2023  Time: 10:21 AM

## 2023-11-22 NOTE — Anesthesia Postprocedure Evaluation (Signed)
 Anesthesia Post Note  Patient: KERRIE TIMM  Procedure(s) Performed: EXCISION, CYST, BREAST (Left: Breast) EXCISION MASS LOWER EXTREMITIES (Left)     Patient location during evaluation: PACU Anesthesia Type: General Level of consciousness: awake and alert Pain management: pain level controlled Vital Signs Assessment: post-procedure vital signs reviewed and stable Respiratory status: spontaneous breathing, nonlabored ventilation, respiratory function stable and patient connected to nasal cannula oxygen Cardiovascular status: blood pressure returned to baseline and stable Postop Assessment: no apparent nausea or vomiting Anesthetic complications: no  No notable events documented.  Last Vitals:  Vitals:   11/22/23 1300 11/22/23 1318  BP: 110/70 113/68  Pulse: 61 62  Resp: 16 20  Temp:  (!) 36.2 C  SpO2: 95% 97%    Last Pain:  Vitals:   11/22/23 1318  TempSrc: Temporal  PainSc: 0-No pain                 Anamae Rochelle L Kolt Mcwhirter

## 2023-11-22 NOTE — Interval H&P Note (Signed)
 History and Physical Interval Note:no change in H and P  11/22/2023 9:19 AM  Jodi Kelly  has presented today for surgery, with the diagnosis of LEFT BREAST/CHEST WALL MASS LEFT THIGH MASS.  The various methods of treatment have been discussed with the patient and family. After consideration of risks, benefits and other options for treatment, the patient has consented to  Procedure(s) with comments: EXCISION, CYST, BREAST (Left) - EXCISION LEFT BREAST/CHEST WALL MASS, EXCISION LEFT THIGH MASS LMA EXCISION MASS LOWER EXTREMITIES (Left) as a surgical intervention.  The patient's history has been reviewed, patient examined, no change in status, stable for surgery.  I have reviewed the patient's chart and labs.  Questions were answered to the patient's satisfaction.     Oza Blumenthal

## 2023-11-22 NOTE — Discharge Instructions (Addendum)
 You may shower starting tomorrow.  No soaking in a tub for 1 week  You may use ice pack, Tylenol , and ibuprofen  also for pain  No vigorous activity for 1 week  You may return to work on Monday, April 28     To whom it may concern:  Ms. Scannell is recovering from surgery performed on 11/22/2023.  She may return to work in normal activity on 11/26/2023.  Thank you,  Oza Blumenthal, MD [facility]   Post Anesthesia Home Care Instructions  Activity: Get plenty of rest for the remainder of the day. A responsible individual must stay with you for 24 hours following the procedure.  For the next 24 hours, DO NOT: -Drive a car -Advertising copywriter -Drink alcoholic beverages -Take any medication unless instructed by your physician -Make any legal decisions or sign important papers.  Meals: Start with liquid foods such as gelatin or soup. Progress to regular foods as tolerated. Avoid greasy, spicy, heavy foods. If nausea and/or vomiting occur, drink only clear liquids until the nausea and/or vomiting subsides. Call your physician if vomiting continues.  Special Instructions/Symptoms: Your throat may feel dry or sore from the anesthesia or the breathing tube placed in your throat during surgery. If this causes discomfort, gargle with warm salt water. The discomfort should disappear within 24 hours.  If you had a scopolamine  patch placed behind your ear for the management of post- operative nausea and/or vomiting:  1. The medication in the patch is effective for 72 hours, after which it should be removed.  Wrap patch in a tissue and discard in the trash. Wash hands thoroughly with soap and water. 2. You may remove the patch earlier than 72 hours if you experience unpleasant side effects which may include dry mouth, dizziness or visual disturbances. 3. Avoid touching the patch. Wash your hands with soap and water after contact with the patch.    Next dose of tylenol  if  needed is at 3:06pm

## 2023-11-22 NOTE — Anesthesia Preprocedure Evaluation (Addendum)
 Anesthesia Evaluation  Patient identified by MRN, date of birth, ID band Patient awake    Reviewed: Allergy & Precautions, NPO status , Patient's Chart, lab work & pertinent test results  History of Anesthesia Complications (+) PONV and history of anesthetic complications  Airway Mallampati: I  TM Distance: >3 FB Neck ROM: Full    Dental  (+) Teeth Intact, Dental Advisory Given, Chipped,    Pulmonary neg pulmonary ROS   Pulmonary exam normal breath sounds clear to auscultation       Cardiovascular negative cardio ROS Normal cardiovascular exam Rhythm:Regular Rate:Normal     Neuro/Psych  Headaches  negative psych ROS   GI/Hepatic negative GI ROS, Neg liver ROS,,,  Endo/Other  diabetes, Type 2, Oral Hypoglycemic Agents    Renal/GU negative Renal ROS  negative genitourinary   Musculoskeletal negative musculoskeletal ROS (+)    Abdominal   Peds  Hematology negative hematology ROS (+)   Anesthesia Other Findings   Reproductive/Obstetrics                             Anesthesia Physical Anesthesia Plan  ASA: 2  Anesthesia Plan: General   Post-op Pain Management: Tylenol  PO (pre-op)*   Induction: Intravenous  PONV Risk Score and Plan: 4 or greater and Ondansetron , Dexamethasone , Midazolam , Treatment may vary due to age or medical condition and Scopolamine  patch - Pre-op  Airway Management Planned: LMA  Additional Equipment:   Intra-op Plan:   Post-operative Plan: Extubation in OR  Informed Consent: I have reviewed the patients History and Physical, chart, labs and discussed the procedure including the risks, benefits and alternatives for the proposed anesthesia with the patient or authorized representative who has indicated his/her understanding and acceptance.     Dental advisory given  Plan Discussed with: CRNA  Anesthesia Plan Comments:        Anesthesia Quick  Evaluation

## 2023-11-22 NOTE — Anesthesia Procedure Notes (Signed)
 Procedure Name: LMA Insertion Date/Time: 11/22/2023 9:52 AM  Performed by: Eugenia Hess, CRNAPre-anesthesia Checklist: Patient identified, Emergency Drugs available, Suction available and Patient being monitored Patient Re-evaluated:Patient Re-evaluated prior to induction Oxygen Delivery Method: Circle system utilized Preoxygenation: Pre-oxygenation with 100% oxygen Induction Type: IV induction Ventilation: Mask ventilation without difficulty LMA: LMA inserted LMA Size: 4.0 Number of attempts: 1 Airway Equipment and Method: Bite block Placement Confirmation: positive ETCO2 Tube secured with: Tape Dental Injury: Teeth and Oropharynx as per pre-operative assessment

## 2023-11-22 NOTE — Transfer of Care (Signed)
 Immediate Anesthesia Transfer of Care Note  Patient: Jodi Kelly  Procedure(s) Performed: EXCISION, CYST, BREAST (Left: Breast) EXCISION MASS LOWER EXTREMITIES (Left)  Patient Location: PACU  Anesthesia Type:General  Level of Consciousness: sedated  Airway & Oxygen Therapy: Patient Spontanous Breathing and Patient connected to face mask oxygen  Post-op Assessment: Report given to RN and Post -op Vital signs reviewed and stable  Post vital signs: Reviewed and stable  Last Vitals:  Vitals Value Taken Time  BP    Temp    Pulse 65 11/22/23 1032  Resp 22 11/22/23 1032  SpO2 100 % 11/22/23 1032  Vitals shown include unfiled device data.  Last Pain:  Vitals:   11/22/23 0900  TempSrc: Temporal  PainSc: 0-No pain      Patients Stated Pain Goal: 3 (11/22/23 0900)  Complications: No notable events documented.

## 2023-11-23 ENCOUNTER — Encounter (HOSPITAL_BASED_OUTPATIENT_CLINIC_OR_DEPARTMENT_OTHER): Payer: Self-pay | Admitting: Surgery

## 2023-11-23 LAB — SURGICAL PATHOLOGY

## 2023-12-19 NOTE — Progress Notes (Signed)
   PROVIDER:  VICENTA DASIE POLI, MD  MRN: [id number] DOB: February 02, 1981 DATE OF ENCOUNTER: 12/19/2023 Interval History:     She is here for her postoperative visit status post excision of a left axillary mass and left thigh mass.  A phone interpreter was used for the visit.  She reports he has minimal discomfort from either incision and has been doing well.    Physical Examination:   Physical Exam   She appears well on exam.  The incision in the left axilla near the breast is well-healed.  The left thigh incision is well-healed as well  The left axillary mass was a large lipoma and the pedunculated mass on the thigh was a keloid.  There was no evidence of malignancy.   Assessment and Plan:       Diagnoses and all orders for this visit:  Postop check     She is doing well postoperatively.  I gave her a copy of the pathology results and we discussed these with the phone interpreter.  I discussed that both these were benign.  She may now resume all her normal activity and I will see her back as needed.    Return if symptoms worsen or fail to improve.   The plan was discussed in detail with the patient today, who expressed understanding.  The patient has my contact information, and understands to call me with any additional questions or concerns in the interval.  I would be happy to see the patient back sooner if the need arises.   VICENTA DASIE POLI, MD

## 2024-02-22 ENCOUNTER — Ambulatory Visit (INDEPENDENT_AMBULATORY_CARE_PROVIDER_SITE_OTHER): Payer: Self-pay

## 2024-02-22 VITALS — BP 115/62 | HR 82 | Temp 98.1°F | Ht 59.0 in | Wt 177.4 lb

## 2024-02-22 DIAGNOSIS — H11221 Conjunctival granuloma, right eye: Secondary | ICD-10-CM

## 2024-02-22 DIAGNOSIS — L237 Allergic contact dermatitis due to plants, except food: Secondary | ICD-10-CM

## 2024-02-22 DIAGNOSIS — R7303 Prediabetes: Secondary | ICD-10-CM

## 2024-02-22 MED ORDER — METFORMIN HCL ER 500 MG PO TB24: 1000.0000 mg | ORAL_TABLET | Freq: Every day | ORAL | 0 refills | Status: DC

## 2024-02-22 MED ORDER — BETAMETHASONE DIPROPIONATE 0.05 % EX OINT: TOPICAL_OINTMENT | Freq: Two times a day (BID) | CUTANEOUS | 0 refills | Status: AC

## 2024-02-22 NOTE — Assessment & Plan Note (Signed)
 Refilled metformin

## 2024-02-22 NOTE — Patient Instructions (Addendum)
 Fue maravilloso verte hoy.Por favor, trae TODOS tus medicamentos contigo a cada visita.Hoy hablamos sobre:Comenzar una crema esteroide de mayor potencia para tu erupcin. Si esto no mejora en la prxima semana, regresa y te trataremos por sarna.Kathlynn por elegir [facility].Por favor, llama al [phone] si tienes alguna pregunta sobre la cita de [place].Por favor, llega al menos 15 minutos antes de tus citas programadas. Ellissa Ayo Alena Morrison, MD [facility]   It was wonderful to see you today.  Please bring ALL of your medications with you to every visit.   Today we talked about:  Starting a higher potency steroid cream for your rash. If this does not improve in the next week, come back and we will treat you for scabies.   Thank you for choosing [facility].   Please call [phone] with any questions about today's appointment.  Please arrive at least 15 minutes prior to your scheduled appointments.  Massai Hankerson Alena Morrison, MD  Family Medicine

## 2024-02-22 NOTE — Progress Notes (Signed)
    SUBJECTIVE:   CHIEF COMPLAINT / HPI:   Right eye In December, she noticed right eye swelling and swelling of the right cheek.  Pain with eye movement. White crust on the eyelid every day in the evenings. No fevers.  Tried oral antibiotics for one month and a hot compress without improvement of the symptoms. Seeing an eye doctor but her next appointment is not until August 4 and she is hoping to have that appt moved closer to today given her eye discomfort.    Left arm rash 4-5 days of left arm and stomach rash. Thought it was poison oak because she was outside to throw out trash and brushed against plants that were around the trash can. Itching. Scratching causes it to enlarge.  Feels hot to touch.  No new environmental exposures other than outside. No one in the house with this rash. Has tried OTC hydrocortisone topical without benefit of the itching.   Needs refill of metformin .  PERTINENT  PMH / PSH: obesity, prediabetes  OBJECTIVE:   BP 115/62   Pulse 82   Temp 98.1 F (36.7 C) (Oral)   Ht 4' 11 (1.499 m)   Wt 177 lb 6 oz (80.5 kg)   LMP 02/13/2024   SpO2 99%   BMI 35.83 kg/m     Vision is 30/20 bilaterally. Extraocular movements intact. No edema of the eyelid. No erythema of the exterior skin. No proptosis. No warmth to touch.    Left forearm and lateral deltoid lesions that are raised and umbilicated with surrounding excoriations and erythema. No warmth. These lesions also affect a small portion of her left trunk, just above the left iliac crest.    ASSESSMENT/PLAN:   Assessment & Plan Allergic contact dermatitis due to plants, except food Suspect likely poison ivy vs poison oak given patient history and overall distribution; however, appearance of lesions are concerning for scabies. Will treat with betamethasone 0.05% ointment BID for a week. If her symptoms do not resolve, she will return on 02/29/24 for clinical reassessment. Could consider empiric  treatment for scabies at that time.  Pyogenic granuloma of conjunctiva, right Patient has appt with ophthalmology on 03/03/24, and we cannot help her move this up. Recommended OTC analgesia in the interim if needed for discomfort. Prediabetes Refilled metformin      Rondle Lohse Alena Morrison, MD [facility]

## 2024-02-29 ENCOUNTER — Ambulatory Visit: Payer: Self-pay

## 2024-03-03 ENCOUNTER — Other Ambulatory Visit: Payer: Self-pay

## 2024-03-10 LAB — DERMATOLOGY PATHOLOGY

## 2024-05-05 NOTE — Progress Notes (Deleted)
    SUBJECTIVE:   Chief compliant/HPI: annual examination  ASHIRA KIRSTEN is a 43 y.o. who presents today for an annual exam.   Prediabetes On metformin  1000 mg daily and atorvastatin  20 mg daily.  Leg swelling?  Due for colonoscopy  History tabs reviewed and updated ***.   Review of systems form reviewed and notable for ***.   OBJECTIVE:   There were no vitals taken for this visit.  ***  ASSESSMENT/PLAN:   Assessment & Plan Prediabetes  Annual Examination  See AVS for age appropriate recommendations.   PHQ score ***, reviewed and discussed.  Blood pressure reviewed and at goal ***.  Asked about intimate partner violence and resources given as appropriate  The patient currently uses *** for contraception. Folate recommended as appropriate, minimum of 400 mcg per day.   Considered the following items based upon USPSTF recommendations: Diabetes screening: {FMCANNUALORDERED:33692} HIV testing:{FMCANNUALORDERED:33692} Hepatitis C: {FMCANNUALORDERED:33692} Hepatitis B:{FMCANNUALORDERED:33692} Syphilis if at high risk: {FMCANNUALORDERED:33692} GC/CT {GC/CT screening :23818} Lipid panel (nonfasting or fasting) discussed based upon AHA recommendations and {FMCLIPID:33694}.  Consider repeat every 4-6 years.  Reviewed risk factors for latent tuberculosis and {not indicated/requested/declined:14582}   Discussed family history, BRCA testing {not indicated/requested/declined:14582}. Tool used to risk stratify was ***.  Cervical cancer screening: prior Pap reviewed, repeat due in 2030 Breast cancer screening: recently completed and repeat not yet indicated.  Normal in 09/2023 Colorectal cancer screening: {crcscreen:23821::discussed, colonoscopy ordered} if age 37 or over.   Follow up in 1 *** year or sooner if indicated.  MyChart Activation: {MYCHARTLIST:32522}  Izetta Nap, MD [facility]

## 2024-05-06 ENCOUNTER — Encounter: Payer: Self-pay | Admitting: Family Medicine

## 2024-05-06 DIAGNOSIS — R7303 Prediabetes: Secondary | ICD-10-CM

## 2024-05-08 ENCOUNTER — Ambulatory Visit

## 2024-05-29 ENCOUNTER — Other Ambulatory Visit: Payer: Self-pay

## 2024-05-29 DIAGNOSIS — R7303 Prediabetes: Secondary | ICD-10-CM

## 2024-06-10 NOTE — Progress Notes (Deleted)
    SUBJECTIVE:   Chief compliant/HPI: annual examination  TAMEAH MIHALKO is a 43 y.o. who presents today for an annual exam.   Itching skin  History tabs reviewed and updated ***.   Review of systems form reviewed and notable for ***.   OBJECTIVE:   There were no vitals taken for this visit.  ***  ASSESSMENT/PLAN:   Assessment & Plan  Annual Examination  See AVS for age appropriate recommendations.   PHQ score ***, reviewed and discussed.  Blood pressure reviewed and at goal ***.  Asked about intimate partner violence and resources given as appropriate  The patient currently uses *** for contraception. Folate recommended as appropriate, minimum of 400 mcg per day.   Considered the following items based upon USPSTF recommendations: Diabetes screening: {FMCANNUALORDERED:33692} HIV testing:{FMCANNUALORDERED:33692} Hepatitis C: {FMCANNUALORDERED:33692} Hepatitis B:{FMCANNUALORDERED:33692} Syphilis if at high risk: {FMCANNUALORDERED:33692} GC/CT {GC/CT screening :23818} Lipid panel (nonfasting or fasting) discussed based upon AHA recommendations and ordered.  Consider repeat every 4-6 years.  Reviewed risk factors for latent tuberculosis and not indicated   Discussed family history, BRCA testing . Cervical cancer screening: prior Pap reviewed, repeat due in 2030 Breast cancer screening: recently completed and repeat not yet indicated Colorectal cancer screening: {crcscreen:23821::discussed, colonoscopy ordered} if age 50 or over.   Follow up in 1 *** year or sooner if indicated.  MyChart Activation: {MYCHARTLIST:32522}  Izetta Nap, MD [facility]

## 2024-06-11 ENCOUNTER — Encounter: Admitting: Family Medicine

## 2024-06-19 NOTE — Progress Notes (Signed)
 SUBJECTIVE:   Chief compliant/HPI: annual examination  Jodi Kelly is a 43 y.o. who presents today for an annual exam.   Discussed the use of AI scribe software for clinical note transcription with the patient, who gave verbal consent to proceed.  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   OBJECTIVE:   BP 130/70   Pulse 85   Ht 4' 11 (1.499 m)   Wt 178 lb (80.7 kg)   SpO2 98%   BMI 35.95 kg/m   General: Well-appearing. Alert. NAD HEENT: Normocephalic. White sclera. No rhinorrhea or congestion CV: RRR without murmur Pulm: CTAB. Normal WOB on RA. No wheezing Abdomen: Soft, non-tender, non-distended. +BS Ext: Well perfused. Cap refill < 3 seconds Skin: Warm, dry.  Mild erythema and skin cracking noted between toes of right foot. MSK: Cervical spine/arms: -TTP over left paraspinal cervical musculature with hypertonicity noted extending down left trapezius -5/5 grip strength bilaterally -Sensation intact distally -Negative Spurling bilaterally   ASSESSMENT/PLAN:   Assessment & Plan Menorrhagia with regular cycle Associated with some abdominal fullness, multiple fibroids noted upon prior TVUS in 2019.  Declined additional imaging today but will trial OCPs to better regulate cycle.  No personal history of  breast cancer or VTE and up-to-date on mammogram. -Trial OCPs daily Cervicalgia Left cervical paraspinal hypertonicity, likely component of nerve impingement given occasional numbness down left arm.  Will trial home neck exercises and NSAIDs for pain relief. -Utilize naproxen  500 mg twice daily as needed Hyperlipidemia, unspecified hyperlipidemia type On rosuvastatin 20 mg daily, due for lipid panel but lab not available in clinic today.  -Obtain lipid panel at follow-up Prediabetes Doing well on metformin  1000 mg daily. -A1c Tinea pedis of both feet Significantly improved after oral terbinafine , mild cracking noted between toes of right foot. -Utilize topical Lamisil  twice daily   Annual Examination  See AVS for age appropriate recommendations.   PHQ score 0, reviewed and discussed.  Blood pressure reviewed and at goal.  Asked about intimate partner violence and resources given as appropriate  The patient currently uses OCP for contraception. Folate recommended as appropriate, minimum of 400 mcg per day.   Considered the following items based upon USPSTF recommendations: Diabetes screening: ordered HIV testing:discussed and declined Hepatitis C: discussed and declined Hepatitis B:discussed and declined Syphilis if at high risk: discussed and declined GC/CT not at high risk and not ordered. Lipid panel (nonfasting or fasting) discussed based upon AHA recommendations and lab not available, will obtain at follow up visit.  Consider repeat every 4-6 years.  Reviewed risk factors for latent tuberculosis and not indicated   Cervical cancer screening: prior Pap reviewed, repeat due in 2030 Breast cancer screening: recently completed and repeat not yet indicated Colorectal cancer screening: discussed options, elected for fecal immunohistochemical testing (available at home, will complete and  return) if age 24 or over.  Vaccines: Advise to contact the health department given uninsured  for Tdap and flu vaccines.  Provided with address and phone number.  Follow up in 1 year or sooner if indicated.  MyChart Activation: Signed up today  Izetta Nap, MD [facility]

## 2024-06-20 ENCOUNTER — Encounter: Payer: Self-pay | Admitting: Family Medicine

## 2024-06-20 ENCOUNTER — Ambulatory Visit (INDEPENDENT_AMBULATORY_CARE_PROVIDER_SITE_OTHER): Payer: Self-pay | Admitting: Family Medicine

## 2024-06-20 VITALS — BP 130/70 | HR 85 | Ht 59.0 in | Wt 178.0 lb

## 2024-06-20 DIAGNOSIS — R7303 Prediabetes: Secondary | ICD-10-CM

## 2024-06-20 DIAGNOSIS — Z0000 Encounter for general adult medical examination without abnormal findings: Secondary | ICD-10-CM

## 2024-06-20 DIAGNOSIS — B353 Tinea pedis: Secondary | ICD-10-CM

## 2024-06-20 DIAGNOSIS — N920 Excessive and frequent menstruation with regular cycle: Secondary | ICD-10-CM

## 2024-06-20 DIAGNOSIS — E785 Hyperlipidemia, unspecified: Secondary | ICD-10-CM

## 2024-06-20 DIAGNOSIS — M542 Cervicalgia: Secondary | ICD-10-CM

## 2024-06-20 LAB — POCT GLYCOSYLATED HEMOGLOBIN (HGB A1C): HbA1c, POC (controlled diabetic range): 6.6 % (ref 0.0–7.0)

## 2024-06-20 MED ORDER — TERBINAFINE HCL 1 % EX CREA
1.0000 | TOPICAL_CREAM | Freq: Two times a day (BID) | CUTANEOUS | 0 refills | Status: AC
Start: 2024-06-20 — End: ?

## 2024-06-20 MED ORDER — NORGESTIMATE-ETH ESTRADIOL 0.25-35 MG-MCG PO TABS
1.0000 | ORAL_TABLET | Freq: Every day | ORAL | 11 refills | Status: AC
Start: 2024-06-20 — End: ?

## 2024-06-20 MED ORDER — METFORMIN HCL ER 500 MG PO TB24: 1000.0000 mg | ORAL_TABLET | Freq: Every day | ORAL | 1 refills | Status: DC

## 2024-06-20 MED ORDER — NAPROXEN 500 MG PO TABS: 500.0000 mg | ORAL_TABLET | Freq: Two times a day (BID) | ORAL | 0 refills | Status: AC

## 2024-06-20 MED ORDER — ATORVASTATIN CALCIUM 20 MG PO TABS: 20.0000 mg | ORAL_TABLET | Freq: Every day | ORAL | 3 refills | Status: AC

## 2024-06-20 NOTE — Patient Instructions (Addendum)
 Fue un gusto verte hoy! Jodi Kelly por elegir [facility].  Por favor, trae TODOS tus medicamentos a cada cita.  Hoy hablamos sobre:  1. Por favor, completa la tarjeta que te dieron en el hospital para la prueba de deteccin de cncer de colon y devulvela. Esto te da acceso a la prueba de probation officer de colon para el prximo ao y podemos hablar sobre repetirla el ao que viene.  2. Para la sensacin de plenitud y pesadez abdominal, envi la receta de las pastillas anticonceptivas orales que tomas a diario para ayudarte con tus sntomas. Tambin puedes usar el antiinflamatorio naproxeno dos veces al da segn sea necesario para los clicos y la incomodidad.  3. Hoy le hicimos la prueba de hemoglobina glicosilada (A1c) y renov la receta de metformina y el medicamento para el colesterol en tu farmacia. Podemos considerar hacerte una prueba de colesterol en tu prxima cita de seguimiento.  4. Por favor, usa  medias de compresin para ayudar con la retencin de lquidos en tus tobillos. Creo que esto tambin ayudar con el dolor ocasional que tienes.  5. Por favor, usa  el antimictico tpico para ayudar con la infeccin en el pie. Puedes usarlo dos veces al da para [place] [employer] sntomas.  6. Por favor, acuda al Departamento de Salud para vacunarse contra la influenza y la tos audria (Tdap).  a. Nios y adultos: [place]: [phone] (Informacin disponible en espaol)  b. En [place], nos encontramos en [address]  Por favor, regrese en 6 meses.  It was wonderful to see you today! Thank you for choosing [facility].   Please bring ALL of your medications with you to every visit.   Today we talked about:  Please complete the card they gave you at the hospital for colon cancer screening and return it.  This provides colon cancer screening for the next year and we can discuss repeating next year. For your abdominal fullness  and heavy.  I did send in the oral birth control that you take every day to help with your symptoms.  You can also use the naproxen  anti-inflammatory twice per day as needed for cramping and discomfort. We checked her A1c today and I refilled the metformin  and cholesterol medication to your pharmacy.  We can consider checking your cholesterol at your next follow-up visit. Please use compression socks to help with the fluid on your ankles. I think this will help with the occasional pain you have as well. Please use the topical antifungal to help with your foot infection. You can use it twice per day for the symptoms. Please go to the Health Department to get the influenza and TDaP shot.  Children and Adults: [place] and [employer]: [phone] (Information in Spanish available) In [place], we are located at [address]   Please follow up in 6 months   If you haven't already, sign up for My Chart to have easy access to your labs results, and communication with your primary care physician.   We are checking some labs today. If they are abnormal, I will call you. If they are normal, I will send you a MyChart message (if it is active) or a letter in the mail. If you do not hear about your labs in the next 2 weeks, please call the office.  Call the clinic at [phone] if your symptoms worsen or you have any concerns.  Please be sure to schedule follow  up at the front desk before you leave today.   Izetta Nap, DO Family Medicine

## 2024-06-20 NOTE — Assessment & Plan Note (Signed)
 Doing well on metformin  1000 mg daily. -A1c

## 2024-06-20 NOTE — Assessment & Plan Note (Signed)
 Significantly improved after oral terbinafine , mild cracking noted between toes of right foot. -Utilize topical Lamisil  twice daily

## 2024-06-25 ENCOUNTER — Ambulatory Visit: Payer: Self-pay | Admitting: Family Medicine

## 2024-06-25 DIAGNOSIS — E119 Type 2 diabetes mellitus without complications: Secondary | ICD-10-CM

## 2024-06-25 MED ORDER — METFORMIN HCL ER 500 MG PO TB24: 1000.0000 mg | ORAL_TABLET | Freq: Two times a day (BID) | ORAL | 1 refills | Status: AC

## 2024-06-25 NOTE — Telephone Encounter (Signed)
 Discussed new type 2 diabetes diagnosis, A1c 6.6. Currently on Metformin  1000mg  daily, will increase to metformin  1000 mg twice daily.  Additionally recommended lifestyle interventions such as reducing amount of carbs from rice and bread.  Patient already on statin therapy, will obtain ACR at follow-up.  Repeat A1c in 3 months.  Jodi Nap, DO
